# Patient Record
Sex: Female | Born: 1935 | Race: White | Hispanic: No | Smoking: Never smoker
Health system: Southern US, Community
[De-identification: ages and names within clinical notes are randomized; demographics above are authoritative.]

## PROBLEM LIST (undated history)

## (undated) DIAGNOSIS — E039 Hypothyroidism, unspecified: Secondary | ICD-10-CM

## (undated) DIAGNOSIS — C8590 Non-Hodgkin lymphoma, unspecified, unspecified site: Secondary | ICD-10-CM

## (undated) DIAGNOSIS — I2510 Atherosclerotic heart disease of native coronary artery without angina pectoris: Secondary | ICD-10-CM

## (undated) DIAGNOSIS — E7800 Pure hypercholesterolemia, unspecified: Secondary | ICD-10-CM

## (undated) DIAGNOSIS — I4891 Unspecified atrial fibrillation: Secondary | ICD-10-CM

## (undated) HISTORY — PX: CORONARY ARTERY BYPASS GRAFT: SHX141

## (undated) HISTORY — PX: TOTAL KNEE ARTHROPLASTY: SHX125

## (undated) HISTORY — PX: APPENDECTOMY: SHX54

## (undated) HISTORY — DX: Hypothyroidism, unspecified: E03.9

## (undated) HISTORY — PX: ENDARTERECTOMY: SHX5162

## (undated) HISTORY — DX: Pure hypercholesterolemia, unspecified: E78.00

## (undated) HISTORY — DX: Unspecified atrial fibrillation: I48.91

## (undated) HISTORY — DX: Non-Hodgkin lymphoma, unspecified, unspecified site: C85.90

## (undated) HISTORY — PX: ABDOMINAL HYSTERECTOMY: SHX81

## (undated) HISTORY — DX: Atherosclerotic heart disease of native coronary artery without angina pectoris: I25.10

---

## 2002-06-24 ENCOUNTER — Other Ambulatory Visit: Admission: RE | Admit: 2002-06-24 | Discharge: 2002-06-24 | Payer: Self-pay | Admitting: Gynecology

## 2002-09-02 ENCOUNTER — Encounter (INDEPENDENT_AMBULATORY_CARE_PROVIDER_SITE_OTHER): Payer: Self-pay | Admitting: *Deleted

## 2002-09-03 ENCOUNTER — Ambulatory Visit (HOSPITAL_COMMUNITY): Admission: RE | Admit: 2002-09-03 | Discharge: 2002-09-03 | Payer: Self-pay | Admitting: Gynecology

## 2003-08-11 ENCOUNTER — Other Ambulatory Visit: Admission: RE | Admit: 2003-08-11 | Discharge: 2003-08-11 | Payer: Self-pay | Admitting: Gynecology

## 2003-11-24 ENCOUNTER — Ambulatory Visit: Payer: Self-pay | Admitting: Oncology

## 2003-12-08 ENCOUNTER — Ambulatory Visit: Payer: Self-pay

## 2003-12-21 ENCOUNTER — Ambulatory Visit: Payer: Self-pay | Admitting: Oncology

## 2004-02-26 ENCOUNTER — Inpatient Hospital Stay: Payer: Self-pay

## 2004-03-23 ENCOUNTER — Ambulatory Visit: Payer: Self-pay | Admitting: Oncology

## 2004-04-19 ENCOUNTER — Ambulatory Visit: Payer: Self-pay | Admitting: Oncology

## 2004-05-20 ENCOUNTER — Ambulatory Visit: Payer: Self-pay | Admitting: Oncology

## 2004-05-29 ENCOUNTER — Ambulatory Visit: Payer: Self-pay | Admitting: Cardiology

## 2004-06-19 ENCOUNTER — Ambulatory Visit: Payer: Self-pay | Admitting: Oncology

## 2004-07-20 ENCOUNTER — Ambulatory Visit: Payer: Self-pay | Admitting: Oncology

## 2004-08-29 ENCOUNTER — Ambulatory Visit: Payer: Self-pay | Admitting: Oncology

## 2004-09-19 ENCOUNTER — Ambulatory Visit: Payer: Self-pay | Admitting: Oncology

## 2004-10-30 ENCOUNTER — Ambulatory Visit: Payer: Self-pay | Admitting: Oncology

## 2004-11-28 ENCOUNTER — Ambulatory Visit: Payer: Self-pay | Admitting: Oncology

## 2004-12-11 ENCOUNTER — Ambulatory Visit: Payer: Self-pay | Admitting: Internal Medicine

## 2004-12-20 ENCOUNTER — Ambulatory Visit: Payer: Self-pay | Admitting: Oncology

## 2005-02-28 ENCOUNTER — Ambulatory Visit: Payer: Self-pay | Admitting: Oncology

## 2005-03-22 ENCOUNTER — Ambulatory Visit: Payer: Self-pay | Admitting: Oncology

## 2005-04-19 ENCOUNTER — Ambulatory Visit: Payer: Self-pay | Admitting: Oncology

## 2005-05-24 ENCOUNTER — Ambulatory Visit: Payer: Self-pay | Admitting: Oncology

## 2005-05-28 ENCOUNTER — Inpatient Hospital Stay: Payer: Self-pay | Admitting: Internal Medicine

## 2005-06-20 ENCOUNTER — Ambulatory Visit: Payer: Self-pay | Admitting: Oncology

## 2005-06-26 ENCOUNTER — Ambulatory Visit: Payer: Self-pay | Admitting: Urology

## 2005-08-15 ENCOUNTER — Ambulatory Visit: Payer: Self-pay | Admitting: Oncology

## 2005-08-17 ENCOUNTER — Ambulatory Visit: Payer: Self-pay | Admitting: Unknown Physician Specialty

## 2005-08-19 ENCOUNTER — Ambulatory Visit: Payer: Self-pay | Admitting: Oncology

## 2005-09-10 ENCOUNTER — Other Ambulatory Visit: Admission: RE | Admit: 2005-09-10 | Discharge: 2005-09-10 | Payer: Self-pay | Admitting: Gynecology

## 2005-10-17 ENCOUNTER — Ambulatory Visit: Payer: Self-pay | Admitting: Oncology

## 2005-10-20 ENCOUNTER — Ambulatory Visit: Payer: Self-pay | Admitting: Oncology

## 2005-12-18 ENCOUNTER — Ambulatory Visit: Payer: Self-pay | Admitting: Internal Medicine

## 2005-12-20 ENCOUNTER — Ambulatory Visit: Payer: Self-pay | Admitting: Unknown Physician Specialty

## 2006-01-15 ENCOUNTER — Ambulatory Visit: Payer: Self-pay | Admitting: Oncology

## 2006-01-19 ENCOUNTER — Ambulatory Visit: Payer: Self-pay | Admitting: Oncology

## 2006-04-15 ENCOUNTER — Ambulatory Visit: Payer: Self-pay | Admitting: Oncology

## 2006-04-20 ENCOUNTER — Ambulatory Visit: Payer: Self-pay | Admitting: Oncology

## 2006-07-21 ENCOUNTER — Ambulatory Visit: Payer: Self-pay | Admitting: Oncology

## 2006-08-13 ENCOUNTER — Ambulatory Visit: Payer: Self-pay | Admitting: Oncology

## 2006-08-20 ENCOUNTER — Ambulatory Visit: Payer: Self-pay | Admitting: Oncology

## 2006-09-20 ENCOUNTER — Ambulatory Visit: Payer: Self-pay | Admitting: Oncology

## 2006-10-21 ENCOUNTER — Ambulatory Visit: Payer: Self-pay | Admitting: Oncology

## 2006-10-24 ENCOUNTER — Ambulatory Visit: Payer: Self-pay | Admitting: Oncology

## 2006-11-20 ENCOUNTER — Ambulatory Visit: Payer: Self-pay | Admitting: Oncology

## 2006-12-24 ENCOUNTER — Ambulatory Visit: Payer: Self-pay | Admitting: Internal Medicine

## 2007-01-20 ENCOUNTER — Ambulatory Visit: Payer: Self-pay | Admitting: Oncology

## 2007-01-29 ENCOUNTER — Ambulatory Visit: Payer: Self-pay | Admitting: Oncology

## 2007-02-20 ENCOUNTER — Ambulatory Visit: Payer: Self-pay | Admitting: Oncology

## 2007-03-19 ENCOUNTER — Other Ambulatory Visit: Admission: RE | Admit: 2007-03-19 | Discharge: 2007-03-19 | Payer: Self-pay | Admitting: Gynecology

## 2007-03-25 ENCOUNTER — Ambulatory Visit: Payer: Self-pay | Admitting: Unknown Physician Specialty

## 2007-04-20 ENCOUNTER — Ambulatory Visit: Payer: Self-pay | Admitting: Oncology

## 2007-04-21 ENCOUNTER — Ambulatory Visit: Payer: Self-pay | Admitting: Unknown Physician Specialty

## 2007-04-30 ENCOUNTER — Ambulatory Visit: Payer: Self-pay | Admitting: Oncology

## 2007-05-21 ENCOUNTER — Ambulatory Visit: Payer: Self-pay | Admitting: Oncology

## 2007-07-21 ENCOUNTER — Ambulatory Visit: Payer: Self-pay | Admitting: Oncology

## 2007-08-07 ENCOUNTER — Ambulatory Visit: Payer: Self-pay | Admitting: Oncology

## 2007-08-20 ENCOUNTER — Ambulatory Visit: Payer: Self-pay | Admitting: Oncology

## 2007-08-20 HISTORY — PX: VAGINA SURGERY: SHX829

## 2007-09-20 ENCOUNTER — Ambulatory Visit: Payer: Self-pay | Admitting: Oncology

## 2007-10-21 ENCOUNTER — Ambulatory Visit: Payer: Self-pay | Admitting: Oncology

## 2007-11-20 ENCOUNTER — Ambulatory Visit: Payer: Self-pay | Admitting: Oncology

## 2007-12-21 ENCOUNTER — Ambulatory Visit: Payer: Self-pay | Admitting: Oncology

## 2008-01-01 ENCOUNTER — Ambulatory Visit: Payer: Self-pay | Admitting: Internal Medicine

## 2008-01-20 ENCOUNTER — Ambulatory Visit: Payer: Self-pay | Admitting: Oncology

## 2008-01-23 ENCOUNTER — Ambulatory Visit: Payer: Self-pay | Admitting: Oncology

## 2008-02-20 ENCOUNTER — Ambulatory Visit: Payer: Self-pay | Admitting: Oncology

## 2008-03-22 ENCOUNTER — Ambulatory Visit: Payer: Self-pay | Admitting: Oncology

## 2008-04-19 ENCOUNTER — Ambulatory Visit: Payer: Self-pay | Admitting: Oncology

## 2008-05-20 ENCOUNTER — Ambulatory Visit: Payer: Self-pay | Admitting: Oncology

## 2008-05-20 ENCOUNTER — Ambulatory Visit: Payer: Self-pay | Admitting: Nurse Practitioner

## 2008-06-19 ENCOUNTER — Ambulatory Visit: Payer: Self-pay | Admitting: Nurse Practitioner

## 2008-06-19 ENCOUNTER — Ambulatory Visit: Payer: Self-pay | Admitting: Oncology

## 2008-07-20 ENCOUNTER — Ambulatory Visit: Payer: Self-pay | Admitting: Oncology

## 2008-07-22 ENCOUNTER — Ambulatory Visit: Payer: Self-pay | Admitting: Cardiology

## 2008-08-12 ENCOUNTER — Ambulatory Visit: Payer: Self-pay | Admitting: Oncology

## 2008-08-19 ENCOUNTER — Ambulatory Visit: Payer: Self-pay | Admitting: Oncology

## 2008-11-19 ENCOUNTER — Ambulatory Visit: Payer: Self-pay | Admitting: Oncology

## 2008-11-29 ENCOUNTER — Ambulatory Visit: Payer: Self-pay | Admitting: Oncology

## 2008-12-20 ENCOUNTER — Ambulatory Visit: Payer: Self-pay | Admitting: Oncology

## 2009-01-05 ENCOUNTER — Ambulatory Visit: Payer: Self-pay | Admitting: Internal Medicine

## 2009-03-22 ENCOUNTER — Ambulatory Visit: Payer: Self-pay | Admitting: Oncology

## 2009-03-28 ENCOUNTER — Ambulatory Visit: Payer: Self-pay | Admitting: Oncology

## 2009-04-19 ENCOUNTER — Ambulatory Visit: Payer: Self-pay | Admitting: Oncology

## 2009-05-24 ENCOUNTER — Inpatient Hospital Stay: Payer: Self-pay | Admitting: Internal Medicine

## 2009-09-19 ENCOUNTER — Ambulatory Visit: Payer: Self-pay | Admitting: Oncology

## 2009-10-17 ENCOUNTER — Ambulatory Visit: Payer: Self-pay | Admitting: Oncology

## 2009-10-20 ENCOUNTER — Ambulatory Visit: Payer: Self-pay | Admitting: Oncology

## 2009-11-19 ENCOUNTER — Ambulatory Visit: Payer: Self-pay | Admitting: Oncology

## 2009-12-20 ENCOUNTER — Ambulatory Visit: Payer: Self-pay | Admitting: Oncology

## 2010-02-19 ENCOUNTER — Observation Stay: Payer: Self-pay | Admitting: Internal Medicine

## 2010-04-25 ENCOUNTER — Ambulatory Visit: Payer: Self-pay | Admitting: Oncology

## 2010-05-21 ENCOUNTER — Ambulatory Visit: Payer: Self-pay | Admitting: Oncology

## 2010-07-07 NOTE — Op Note (Signed)
NAME:  Anne [name], Anne [name]                       ACCOUNT NO.:  [id number]   MEDICAL RECORD NO.:  [id number]                   PATIENT TYPE:  AMB   LOCATION:  SDC                                  FACILITY:  [facility]   PHYSICIAN:  Timothy P. Fontaine, M.D.           DATE OF BIRTH:  1928-01-30   DATE OF PROCEDURE:  09/03/2002  DATE OF DISCHARGE:                                 OPERATIVE REPORT   PREOPERATIVE DIAGNOSIS:  Persistent vaginal granulation tissue.   POSTOPERATIVE DIAGNOSIS:  Persistent vaginal granulation tissue.   PROCEDURE:  Excision, suspected granulation tissue of the vagina.   SURGEON:  Timothy P. Fontaine, M.D.   ANESTHETIC:  0.25% Marcaine local, inhalational gas with nitrous.   ESTIMATED BLOOD LOSS:  Minimal.   COMPLICATIONS:  None.   SPECIMENS:  Suspected granulation tissue of vagina.   FINDINGS:  Significant rectocele, cuff well supported, second-degree  cystocele, bimanual without gross masses.  Two areas of beefy-red  granulation-type tissue, posterior vaginal mucosa, both measuring  approximately 5-7 mm across.   PROCEDURE:  The patient was taken to the operating room, received  inhalational relaxation with nitrous, placed in the low dorsal lithotomy  position, and received [name] perineal, vaginal preparation with Betadine  solution.  The bladder was emptied with in-and-out Foley catheterization and  the patient was draped in the usual fashion.  An EUA was performed and  subsequently, with [name] Simms retractor, the posterior rectocele was delivered  and inspected and the two areas of granulation-type tissue were exposed.  Both of these areas were infiltrated using 0.25% Marcaine submucosally  raising [name] bleb under each area.  The upper area was then grasped with an  Adson pickup and was excised with [name] scalpel at the level of the surrounding  mucosa.  Care was taken not to enter into the submucosal or rectovaginal  area given the extent of her prior surgery.  After  excision of this area, it  was noted that there was an extrusion of several droplets of milky-type  material from the incision site and the question of whether this represented  pus from an underlying infection versus extrusion of some of the local  anesthetic from the submucosal tissues.  I also entertained the possibility  of [name] rectovaginal fistula from her prior surgery and whether this was the  etiology of her chronic granulation tissue in this area.  I performed [name]  rectal exam and palpated over this area trying to milk out any possible  fecal material to demonstrate [name] rectovaginal fistula and there were no  further whitish extrusions or any evidence of any fecal material coming from  this area.  The rectovaginal septum felt fairly thick throughout the area  with no paper thin areas.  I cleansed this open mucosal defect with Betadine  in the event that it was [name] small collection of pus in attempt to sterilize  the area then placed [name] single suture  of 3-0 Vicryl interrupted to  reapproximate the mucosa noting again that the defect was small, measuring  approximately 5 mm.  The lower granulation tissue area was then again tented  with Adson pickups and sharply incised with [name] scalpel at the level of the  surrounding mucosa.  Care was again given not to enter into the submucosal  plane to avoid underlying tissue damage.  This area was likewise cleansed  with Betadine.  There was no evidence of any whitish fluid at this point.  I  then again performed [name] rectovaginal exam after excision of the granulation  tissue to make sure there was no demonstrable rectovaginal fistula and there  was no evidence of fecal material at this site.  The defect in the mucosa  was then closed using two 3-0 Vicryl interrupted sutures.  The underlying  tissue in the event that there was [name] small collection of pus in this area.  The patient was then placed in the supine position.  She tolerated the  procedure well.   She was taken to the recovery room.                                               Timothy P. Audie Box, M.D.    TPF/MEDQ  D:  09/03/2002  T:  09/04/2002  Job:  [id number]

## 2010-07-07 NOTE — H&P (Signed)
NAME:  Anne Anne [name], Anne Anne [name]                       ACCOUNT NO.:  [id number]   MEDICAL RECORD NO.:  [id number]                   PATIENT TYPE:  AMB   LOCATION:  SDC                                  [facility]ILITY:  [facility]   PHYSICIAN:  Timothy P. Fontaine, M.D.           DATE OF BIRTH:  [birth year]   DATE OF ADMISSION:  09/03/2002  DATE OF DISCHARGE:                                HISTORY & PHYSICAL   CHIEF COMPLAINT:  Vaginal spotting.   HISTORY OF PRESENT ILLNESS:  Anne [name] 77 year old, G3, P3, female with history of  stress urinary incontinence, rectocele, enterocele, who underwent Anne [name]  Durasphere implant, rectocele repair with graft by Dr. Rolanda Jay at  [facility] in January 2003.  Postoperatively, her  course had been complicated by persistent granulation tissue and Anne [name]  recurrence of her rectocele.  She had been receiving silver nitrate  applications to the areas of granulation tissue over the past year with  persistence of two areas of granulation tissue which have continued to  bleed.  She had transferred care to our practice and had Anne [name] biopsy of one of  these areas which showed inflamed granulation tissue.  She is admitted at  this time for excision of the granulation tissue.   PAST MEDICAL HISTORY:  Significant for atherosclerotic heart disease status  post CABG times three, and endarterectomy.  History of MI x2, asthma,  chronic hypertension, hypothyroidism, hypercholesterolemia, GERD, and non-  Hodgkin's lymphoma status post chemotherapy.   PAST SURGICAL HISTORY:  Coronary artery bypass graft x3, endarterectomy,  total abdominal hysterectomy, RSO, appendectomy, tonsillectomy, posterior  repair with Durasphere implant.   ALLERGIES:  BENADRYL.   MEDICATIONS:  1. Caltrate 600 with D two times Anne [name] day.  2. Prilosec 20 daily.  3. Synthroid 0.88 mg daily.  4. Ecotrin 81 mg daily.  5. Lipitor 20 mg daily.  6. Cozaar 50 mg daily.  7. Lopressor 50 mg one and Anne [name] half tabs  two times per day.  8. Lasix 40 mg daily.  9. K-Dur 20 mg daily.  10.      Allegra 180 mg daily.  11.      Singulair 10 mg daily.  12.      Flonase 50 mg spray daily.  13.      Advair one spray q.12h.   REVIEW OF SYSTEMS:  Noncontributory.   SOCIAL HISTORY:  Noncontributory.   FAMILY HISTORY:  Noncontributory.   ADMISSION PHYSICAL EXAMINATION:  VITAL SIGNS:  Afebrile.  Vital signs are  stable.  HEENT:  Normal.  LUNGS:  Clear.  CARDIAC:  Regular rate.  No rubs, murmurs, or gallops.  ABDOMEN:  Benign.  PELVIC:  External BUS, vagina with first to second degree cystocele, second  to third degree rectocele with two areas of granulation tissue posteriorly.  Bimanual is without masses or tenderness.   ASSESSMENT:  Anne [name] 77 year old, G3, P3, female status post Durasphere implant,  rectocele repair  with graft, January 2003, with persistent granulation  tissue.  The areas are very superficial and have been treated multiple times  with silver nitrate and have not resolved themselves.  The patient is  admitted at this time for Anne [name] superficial excision of these areas.  I  discussed with the patient the proposed surgery.  She understands that we  are not going to repair her rectocele or deliver any definitive surgery as  far as her pelvic relaxation is concerned.  We are going to superficially  excise the areas of granulation tissue and reapproximate the mucosa, and  then hopefully this will resolve her spotting and bleeding as Anne [name] result of  these granulation tissue areas.  I discussed the risks including infection,  abscess formation, and drainage, rectal damage with fistula, hemorrhage  necessitating transfusion, were all discussed, understood, and accepted.  The patient's questions were answered to her satisfaction, and she is ready  to proceed with surgery.  Due to her significant medical history, I  discussed with her that I plan on doing this under local, which I think will  be easy to  accomplish, and will plan on some submucosal local injection with  again Anne [name] superficial excision and reapproximation of the mucosa.  The  patient's questions were answered to her satisfaction, and she is ready to  proceed with surgery.                                               Timothy P. Audie Box, M.D.    TPF/MEDQ  D:  09/02/2002  T:  09/03/2002  Job:  [id number]

## 2010-07-20 ENCOUNTER — Ambulatory Visit (INDEPENDENT_AMBULATORY_CARE_PROVIDER_SITE_OTHER): Payer: Medicare Other | Admitting: Gynecology

## 2010-07-20 DIAGNOSIS — R350 Frequency of micturition: Secondary | ICD-10-CM

## 2010-07-20 DIAGNOSIS — N952 Postmenopausal atrophic vaginitis: Secondary | ICD-10-CM

## 2010-07-20 DIAGNOSIS — R82998 Other abnormal findings in urine: Secondary | ICD-10-CM

## 2010-08-07 ENCOUNTER — Emergency Department: Payer: Self-pay

## 2010-08-14 ENCOUNTER — Emergency Department: Payer: Self-pay | Admitting: Emergency Medicine

## 2010-11-06 ENCOUNTER — Ambulatory Visit: Payer: Self-pay | Admitting: Oncology

## 2010-11-20 ENCOUNTER — Ambulatory Visit: Payer: Self-pay | Admitting: Oncology

## 2011-01-29 ENCOUNTER — Ambulatory Visit: Payer: Self-pay | Admitting: Oncology

## 2011-02-20 ENCOUNTER — Ambulatory Visit: Payer: Self-pay | Admitting: Oncology

## 2011-04-27 ENCOUNTER — Inpatient Hospital Stay: Payer: Self-pay | Admitting: Student

## 2011-04-27 LAB — URINALYSIS, COMPLETE
Hyaline Cast: 9
Ketone: NEGATIVE
Leukocyte Esterase: NEGATIVE
Nitrite: NEGATIVE
Ph: 5 (ref 4.5–8.0)
Protein: NEGATIVE
RBC,UR: 1 /HPF (ref 0–5)

## 2011-04-27 LAB — COMPREHENSIVE METABOLIC PANEL
Albumin: 3.1 g/dL — ABNORMAL LOW (ref 3.4–5.0)
Alkaline Phosphatase: 166 U/L — ABNORMAL HIGH (ref 50–136)
Anion Gap: 15 (ref 7–16)
Calcium, Total: 9.1 mg/dL (ref 8.5–10.1)
Co2: 26 mmol/L (ref 21–32)
EGFR (African American): 25 — ABNORMAL LOW
EGFR (Non-African Amer.): 21 — ABNORMAL LOW
Glucose: 76 mg/dL (ref 65–99)
Osmolality: 272 (ref 275–301)
Potassium: 4.3 mmol/L (ref 3.5–5.1)
SGOT(AST): 29 U/L (ref 15–37)

## 2011-04-27 LAB — CBC
HCT: 27.9 % — ABNORMAL LOW (ref 35.0–47.0)
HGB: 9.4 g/dL — ABNORMAL LOW (ref 12.0–16.0)
MCH: 34.8 pg — ABNORMAL HIGH (ref 26.0–34.0)
MCHC: 33.7 g/dL (ref 32.0–36.0)
MCV: 103 fL — ABNORMAL HIGH (ref 80–100)
Platelet: 152 10*3/uL (ref 150–440)
RBC: 2.7 10*6/uL — ABNORMAL LOW (ref 3.80–5.20)
WBC: 10.1 10*3/uL (ref 3.6–11.0)

## 2011-04-27 LAB — PROTIME-INR: INR: 3.6

## 2011-04-27 LAB — CK TOTAL AND CKMB (NOT AT ARMC)
CK, Total: 32 U/L (ref 21–215)
CK-MB: 1.1 ng/mL (ref 0.5–3.6)

## 2011-04-27 LAB — FOLATE: Folic Acid: 12.2 ng/mL (ref 3.1–100.0)

## 2011-04-27 LAB — PRO B NATRIURETIC PEPTIDE: B-Type Natriuretic Peptide: 29434 pg/mL — ABNORMAL HIGH (ref 0–450)

## 2011-04-28 LAB — BASIC METABOLIC PANEL
Anion Gap: 16 (ref 7–16)
BUN: 56 mg/dL — ABNORMAL HIGH (ref 7–18)
Calcium, Total: 8.6 mg/dL (ref 8.5–10.1)
EGFR (Non-African Amer.): 23 — ABNORMAL LOW
Glucose: 75 mg/dL (ref 65–99)
Osmolality: 271 (ref 275–301)
Potassium: 4.1 mmol/L (ref 3.5–5.1)
Sodium: 128 mmol/L — ABNORMAL LOW (ref 136–145)

## 2011-04-28 LAB — CBC WITH DIFFERENTIAL/PLATELET
Basophil #: 0 10*3/uL (ref 0.0–0.1)
Basophil %: 0 %
Eosinophil #: 0 10*3/uL (ref 0.0–0.7)
MCHC: 34.5 g/dL (ref 32.0–36.0)
MCV: 102 fL — ABNORMAL HIGH (ref 80–100)
Monocyte #: 0.5 10*3/uL (ref 0.0–0.7)
Monocyte %: 5.1 %
Neutrophil #: 8.1 10*3/uL — ABNORMAL HIGH (ref 1.4–6.5)
Neutrophil %: 89.4 %
Platelet: 130 10*3/uL — ABNORMAL LOW (ref 150–440)
RBC: 2.51 10*6/uL — ABNORMAL LOW (ref 3.80–5.20)
WBC: 9 10*3/uL (ref 3.6–11.0)

## 2011-04-28 LAB — HEPATIC FUNCTION PANEL A (ARMC)
Albumin: 2.6 g/dL — ABNORMAL LOW (ref 3.4–5.0)
Alkaline Phosphatase: 144 U/L — ABNORMAL HIGH (ref 50–136)
SGOT(AST): 20 U/L (ref 15–37)
SGPT (ALT): 18 U/L
Total Protein: 6.6 g/dL (ref 6.4–8.2)

## 2011-04-28 LAB — CK TOTAL AND CKMB (NOT AT ARMC): CK, Total: 32 U/L (ref 21–215)

## 2011-04-28 LAB — HEMOGLOBIN A1C: Hemoglobin A1C: 5.7 % (ref 4.2–6.3)

## 2011-04-28 LAB — PROTIME-INR
INR: 5.1
Prothrombin Time: 46.7 secs — ABNORMAL HIGH (ref 11.5–14.7)

## 2011-04-28 LAB — LIPID PANEL: Ldl Cholesterol, Calc: 54 mg/dL (ref 0–100)

## 2011-04-29 ENCOUNTER — Ambulatory Visit: Payer: Self-pay | Admitting: Urology

## 2011-04-29 LAB — CULTURE, BLOOD (SINGLE)

## 2011-04-29 LAB — CREATININE, SERUM
EGFR (African American): 36 — ABNORMAL LOW
EGFR (Non-African Amer.): 30 — ABNORMAL LOW

## 2011-04-29 LAB — URINALYSIS, COMPLETE
Bilirubin,UR: NEGATIVE
Glucose,UR: NEGATIVE mg/dL (ref 0–75)
Ketone: NEGATIVE
Nitrite: NEGATIVE
Ph: 6 (ref 4.5–8.0)
RBC,UR: 1 /HPF (ref 0–5)
Squamous Epithelial: NONE SEEN
WBC UR: 1 /HPF (ref 0–5)

## 2011-04-29 LAB — PROTIME-INR
INR: 1.5
Prothrombin Time: 18.5 secs — ABNORMAL HIGH (ref 11.5–14.7)

## 2011-04-30 LAB — BASIC METABOLIC PANEL
Anion Gap: 13 (ref 7–16)
Calcium, Total: 8.6 mg/dL (ref 8.5–10.1)
Co2: 25 mmol/L (ref 21–32)
Creatinine: 0.9 mg/dL (ref 0.60–1.30)
EGFR (African American): >60
EGFR (Non-African Amer.): >60
Glucose: 98 mg/dL (ref 65–99)
Sodium: 135 mmol/L — ABNORMAL LOW (ref 136–145)

## 2011-04-30 LAB — PROTIME-INR
INR: 1.1
Prothrombin Time: 14.3 secs (ref 11.5–14.7)

## 2011-04-30 LAB — STOOL CULTURE

## 2011-05-01 LAB — PROTIME-INR
INR: 1.3
Prothrombin Time: 16.5 secs — ABNORMAL HIGH (ref 11.5–14.7)

## 2011-05-02 LAB — COMPREHENSIVE METABOLIC PANEL
Alkaline Phosphatase: 150 U/L — ABNORMAL HIGH (ref 50–136)
Anion Gap: 11 (ref 7–16)
BUN: 19 mg/dL — ABNORMAL HIGH (ref 7–18)
Bilirubin,Total: 0.5 mg/dL (ref 0.2–1.0)
Co2: 27 mmol/L (ref 21–32)
Creatinine: 0.94 mg/dL (ref 0.60–1.30)
EGFR (African American): >60
EGFR (Non-African Amer.): >60
Glucose: 84 mg/dL (ref 65–99)
Potassium: 4.6 mmol/L (ref 3.5–5.1)
SGPT (ALT): 11 U/L — ABNORMAL LOW
Total Protein: 6.2 g/dL — ABNORMAL LOW (ref 6.4–8.2)

## 2011-05-02 LAB — PROTIME-INR
INR: 1.6
Prothrombin Time: 19.5 secs — ABNORMAL HIGH (ref 11.5–14.7)

## 2011-05-03 LAB — PROTIME-INR
INR: 1.8
Prothrombin Time: 20.8 secs — ABNORMAL HIGH (ref 11.5–14.7)

## 2011-05-04 LAB — PROTIME-INR: Prothrombin Time: 22.9 secs — ABNORMAL HIGH (ref 11.5–14.7)

## 2011-05-04 LAB — THROAT CULTURE

## 2011-05-05 LAB — PROTIME-INR
INR: 2.6
Prothrombin Time: 28.2 secs — ABNORMAL HIGH (ref 11.5–14.7)

## 2011-05-05 LAB — CULTURE, BLOOD (SINGLE)

## 2011-05-06 LAB — CBC WITH DIFFERENTIAL/PLATELET
Basophil %: 0.3 %
Eosinophil #: 0.1 10*3/uL (ref 0.0–0.7)
HCT: 29.2 % — ABNORMAL LOW (ref 35.0–47.0)
HGB: 9.8 g/dL — ABNORMAL LOW (ref 12.0–16.0)
Lymphocyte %: 9.8 %
MCHC: 33.4 g/dL (ref 32.0–36.0)
Monocyte #: 1 10*3/uL — ABNORMAL HIGH (ref 0.0–0.7)
Monocyte %: 11.9 %
Neutrophil #: 6.8 10*3/uL — ABNORMAL HIGH (ref 1.4–6.5)
Neutrophil %: 77.2 %
Platelet: 422 10*3/uL (ref 150–440)
RDW: 12.9 % (ref 11.5–14.5)
WBC: 8.8 10*3/uL (ref 3.6–11.0)

## 2011-05-06 LAB — BASIC METABOLIC PANEL
Anion Gap: 8 (ref 7–16)
BUN: 23 mg/dL — ABNORMAL HIGH (ref 7–18)
Creatinine: 0.91 mg/dL (ref 0.60–1.30)
EGFR (African American): >60
EGFR (Non-African Amer.): >60
Glucose: 79 mg/dL (ref 65–99)
Potassium: 5 mmol/L (ref 3.5–5.1)

## 2011-05-07 LAB — PROTIME-INR
INR: 2.3
Prothrombin Time: 25.1 secs — ABNORMAL HIGH (ref 11.5–14.7)

## 2011-05-07 LAB — DIGOXIN LEVEL: Digoxin: 3.2 ng/mL

## 2011-05-08 LAB — DIGOXIN LEVEL: Digoxin: 2.7 ng/mL

## 2011-05-10 ENCOUNTER — Encounter: Payer: Self-pay | Admitting: Internal Medicine

## 2011-05-10 LAB — DIGOXIN LEVEL: Digoxin: 1.53 ng/mL

## 2011-05-15 LAB — CBC WITH DIFFERENTIAL/PLATELET
Basophil #: 0 10*3/uL (ref 0.0–0.1)
Basophil %: 1 %
Eosinophil %: 3.6 %
HCT: 28.4 % — ABNORMAL LOW (ref 35.0–47.0)
Lymphocyte #: 0.8 10*3/uL — ABNORMAL LOW (ref 1.0–3.6)
Lymphocyte %: 17.2 %
MCHC: 33.7 g/dL (ref 32.0–36.0)
MCV: 99 fL (ref 80–100)
Monocyte #: 0.9 10*3/uL — ABNORMAL HIGH (ref 0.0–0.7)
Monocyte %: 18.1 %
Neutrophil #: 3 10*3/uL (ref 1.4–6.5)
Platelet: 323 10*3/uL (ref 150–440)
RDW: 12.7 % (ref 11.5–14.5)

## 2011-05-21 ENCOUNTER — Encounter: Payer: Self-pay | Admitting: Internal Medicine

## 2011-05-29 ENCOUNTER — Inpatient Hospital Stay: Payer: Self-pay | Admitting: *Deleted

## 2011-05-29 LAB — URINALYSIS, COMPLETE
Bilirubin,UR: NEGATIVE
Leukocyte Esterase: NEGATIVE
Nitrite: NEGATIVE
Ph: 7 (ref 4.5–8.0)
Specific Gravity: 1.011 (ref 1.003–1.030)
Squamous Epithelial: NONE SEEN

## 2011-05-29 LAB — COMPREHENSIVE METABOLIC PANEL
Albumin: 3.5 g/dL (ref 3.4–5.0)
Alkaline Phosphatase: 167 U/L — ABNORMAL HIGH (ref 50–136)
Anion Gap: 10 (ref 7–16)
BUN: 10 mg/dL (ref 7–18)
Chloride: 88 mmol/L — ABNORMAL LOW (ref 98–107)
Creatinine: 0.9 mg/dL (ref 0.60–1.30)
EGFR (African American): >60
EGFR (Non-African Amer.): >60
Glucose: 97 mg/dL (ref 65–99)
Osmolality: 249 (ref 275–301)
Potassium: 4.7 mmol/L (ref 3.5–5.1)
SGPT (ALT): 18 U/L
Sodium: 124 mmol/L — ABNORMAL LOW (ref 136–145)
Total Protein: 6.6 g/dL (ref 6.4–8.2)

## 2011-05-29 LAB — CBC
HGB: 9.7 g/dL — ABNORMAL LOW (ref 12.0–16.0)
RDW: 13.9 % (ref 11.5–14.5)

## 2011-05-29 LAB — CK TOTAL AND CKMB (NOT AT ARMC): CK-MB: 4.7 ng/mL — ABNORMAL HIGH (ref 0.5–3.6)

## 2011-05-30 LAB — BASIC METABOLIC PANEL
BUN: 12 mg/dL (ref 7–18)
Calcium, Total: 9.4 mg/dL (ref 8.5–10.1)
Co2: 21 mmol/L (ref 21–32)
Creatinine: 0.82 mg/dL (ref 0.60–1.30)
EGFR (African American): >60
Glucose: 83 mg/dL (ref 65–99)
Potassium: 4.3 mmol/L (ref 3.5–5.1)
Sodium: 124 mmol/L — ABNORMAL LOW (ref 136–145)

## 2011-05-30 LAB — CBC WITH DIFFERENTIAL/PLATELET
Basophil #: 0 10*3/uL (ref 0.0–0.1)
Basophil %: 0.2 %
Eosinophil #: 0 10*3/uL (ref 0.0–0.7)
Lymphocyte #: 1.3 10*3/uL (ref 1.0–3.6)
Lymphocyte %: 11.9 %
MCH: 32.9 pg (ref 26.0–34.0)
MCHC: 33.3 g/dL (ref 32.0–36.0)
Neutrophil #: 8.1 10*3/uL — ABNORMAL HIGH (ref 1.4–6.5)
Neutrophil %: 76.5 %
Platelet: 208 10*3/uL (ref 150–440)
RBC: 3.01 10*6/uL — ABNORMAL LOW (ref 3.80–5.20)
WBC: 10.6 10*3/uL (ref 3.6–11.0)

## 2011-05-30 LAB — PROTIME-INR
INR: 1.2
Prothrombin Time: 16 secs — ABNORMAL HIGH (ref 11.5–14.7)

## 2011-05-30 LAB — CK TOTAL AND CKMB (NOT AT ARMC): CK-MB: 18.5 ng/mL — ABNORMAL HIGH (ref 0.5–3.6)

## 2011-05-30 LAB — MAGNESIUM: Magnesium: 1.4 mg/dL — ABNORMAL LOW

## 2011-05-30 LAB — TROPONIN I: Troponin-I: <0.02 ng/mL

## 2011-05-31 LAB — MAGNESIUM: Magnesium: 2 mg/dL

## 2011-05-31 LAB — LIPID PANEL
Cholesterol: 119 mg/dL (ref 0–200)
HDL Cholesterol: 34 mg/dL — ABNORMAL LOW (ref 40–60)
Triglycerides: 76 mg/dL (ref 0–200)

## 2011-06-01 LAB — BASIC METABOLIC PANEL
Anion Gap: 11 (ref 7–16)
Co2: 22 mmol/L (ref 21–32)
Creatinine: 0.68 mg/dL (ref 0.60–1.30)
EGFR (African American): >60
EGFR (Non-African Amer.): >60
Glucose: 78 mg/dL (ref 65–99)
Potassium: 3.5 mmol/L (ref 3.5–5.1)

## 2011-06-01 LAB — SODIUM, URINE, RANDOM: Sodium, Urine Random: 74 mmol/L (ref 20–110)

## 2011-06-01 LAB — PROTIME-INR: INR: 1.4

## 2011-06-01 LAB — OSMOLALITY, URINE: Osmolality: 501 mOsm/kg

## 2011-06-03 LAB — BASIC METABOLIC PANEL
Anion Gap: 10 (ref 7–16)
Co2: 23 mmol/L (ref 21–32)
Creatinine: 0.69 mg/dL (ref 0.60–1.30)
EGFR (African American): >60
EGFR (Non-African Amer.): >60
Glucose: 87 mg/dL (ref 65–99)

## 2011-06-03 LAB — PROTIME-INR
INR: 1.4
Prothrombin Time: 17.7 secs — ABNORMAL HIGH (ref 11.5–14.7)

## 2011-06-03 LAB — CULTURE, BLOOD (SINGLE)

## 2011-06-04 LAB — CULTURE, BLOOD (SINGLE)

## 2011-06-04 LAB — BASIC METABOLIC PANEL
BUN: 8 mg/dL (ref 7–18)
Calcium, Total: 7.9 mg/dL — ABNORMAL LOW (ref 8.5–10.1)
Co2: 22 mmol/L (ref 21–32)
Creatinine: 0.63 mg/dL (ref 0.60–1.30)
EGFR (Non-African Amer.): >60
Glucose: 80 mg/dL (ref 65–99)
Potassium: 3.7 mmol/L (ref 3.5–5.1)
Sodium: 135 mmol/L — ABNORMAL LOW (ref 136–145)

## 2011-06-04 LAB — PROTIME-INR: Prothrombin Time: 20.5 secs — ABNORMAL HIGH (ref 11.5–14.7)

## 2011-06-20 ENCOUNTER — Encounter: Payer: Self-pay | Admitting: Internal Medicine

## 2011-06-30 ENCOUNTER — Emergency Department: Payer: Self-pay | Admitting: *Deleted

## 2011-06-30 LAB — BASIC METABOLIC PANEL
Calcium, Total: 8.9 mg/dL (ref 8.5–10.1)
Chloride: 96 mmol/L — ABNORMAL LOW (ref 98–107)
Co2: 24 mmol/L (ref 21–32)
Creatinine: 0.93 mg/dL (ref 0.60–1.30)
EGFR (Non-African Amer.): 58 — ABNORMAL LOW
Potassium: 4.1 mmol/L (ref 3.5–5.1)
Sodium: 129 mmol/L — ABNORMAL LOW (ref 136–145)

## 2011-06-30 LAB — CBC WITH DIFFERENTIAL/PLATELET
Basophil #: 0 10*3/uL (ref 0.0–0.1)
Basophil %: 0.4 %
Eosinophil #: 0.1 10*3/uL (ref 0.0–0.7)
Eosinophil %: 0.9 %
HGB: 10.5 g/dL — ABNORMAL LOW (ref 12.0–16.0)
Lymphocyte %: 24.5 %
MCH: 33.4 pg (ref 26.0–34.0)
Monocyte #: 0.8 x10 3/mm (ref 0.2–0.9)
Monocyte %: 12.6 %
Neutrophil %: 61.6 %
RBC: 3.13 10*6/uL — ABNORMAL LOW (ref 3.80–5.20)

## 2011-06-30 LAB — PROTIME-INR
INR: 2.5
Prothrombin Time: 27.3 secs — ABNORMAL HIGH (ref 11.5–14.7)

## 2011-07-05 ENCOUNTER — Emergency Department: Payer: Self-pay | Admitting: *Deleted

## 2011-07-05 LAB — CBC WITH DIFFERENTIAL/PLATELET
Basophil #: 0 10*3/uL (ref 0.0–0.1)
Eosinophil #: 0.1 10*3/uL (ref 0.0–0.7)
HGB: 10.6 g/dL — ABNORMAL LOW (ref 12.0–16.0)
Lymphocyte #: 1.5 10*3/uL (ref 1.0–3.6)
Lymphocyte %: 22.1 %
MCHC: 32.9 g/dL (ref 32.0–36.0)
Monocyte #: 0.9 x10 3/mm (ref 0.2–0.9)
Neutrophil #: 4.2 10*3/uL (ref 1.4–6.5)
Neutrophil %: 63.3 %
Platelet: 242 10*3/uL (ref 150–440)
RBC: 3.26 10*6/uL — ABNORMAL LOW (ref 3.80–5.20)
RDW: 15.1 % — ABNORMAL HIGH (ref 11.5–14.5)
WBC: 6.7 10*3/uL (ref 3.6–11.0)

## 2011-07-05 LAB — URINALYSIS, COMPLETE
Hyaline Cast: 1
Leukocyte Esterase: NEGATIVE
Nitrite: NEGATIVE
Ph: 9 (ref 4.5–8.0)
Specific Gravity: 1.016 (ref 1.003–1.030)

## 2011-07-05 LAB — PROTIME-INR: INR: 3.4

## 2011-07-30 ENCOUNTER — Ambulatory Visit: Payer: Self-pay | Admitting: Oncology

## 2011-07-30 LAB — COMPREHENSIVE METABOLIC PANEL
Alkaline Phosphatase: 193 U/L — ABNORMAL HIGH (ref 50–136)
Anion Gap: 10 (ref 7–16)
Calcium, Total: 9.1 mg/dL (ref 8.5–10.1)
Chloride: 96 mmol/L — ABNORMAL LOW (ref 98–107)
Co2: 25 mmol/L (ref 21–32)
Creatinine: 1.25 mg/dL (ref 0.60–1.30)
Glucose: 95 mg/dL (ref 65–99)
Potassium: 4.8 mmol/L (ref 3.5–5.1)
SGOT(AST): 18 U/L (ref 15–37)
SGPT (ALT): 16 U/L

## 2011-07-30 LAB — CBC CANCER CENTER
Basophil #: 0 x10 3/mm (ref 0.0–0.1)
Eosinophil %: 2.1 %
Lymphocyte #: 1.3 x10 3/mm (ref 1.0–3.6)
MCH: 32.7 pg (ref 26.0–34.0)
MCHC: 32.8 g/dL (ref 32.0–36.0)
MCV: 100 fL (ref 80–100)
Monocyte #: 0.8 x10 3/mm (ref 0.2–0.9)
Monocyte %: 14.1 %
Platelet: 270 x10 3/mm (ref 150–440)
RDW: 16 % — ABNORMAL HIGH (ref 11.5–14.5)

## 2011-08-20 ENCOUNTER — Ambulatory Visit: Payer: Self-pay | Admitting: Oncology

## 2011-11-28 ENCOUNTER — Emergency Department: Payer: Self-pay | Admitting: Emergency Medicine

## 2011-11-29 LAB — CBC WITH DIFFERENTIAL/PLATELET
Eosinophil #: 0.2 10*3/uL (ref 0.0–0.7)
Eosinophil %: 2.9 %
MCH: 34.9 pg — ABNORMAL HIGH (ref 26.0–34.0)
Monocyte #: 0.9 x10 3/mm (ref 0.2–0.9)
Neutrophil %: 70.1 %
Platelet: 202 10*3/uL (ref 150–440)

## 2012-01-04 ENCOUNTER — Other Ambulatory Visit: Payer: Self-pay | Admitting: Podiatry

## 2012-01-20 ENCOUNTER — Ambulatory Visit: Payer: Self-pay | Admitting: Internal Medicine

## 2012-01-23 ENCOUNTER — Inpatient Hospital Stay: Payer: Self-pay | Admitting: Internal Medicine

## 2012-01-23 LAB — URINALYSIS, COMPLETE
Bilirubin,UR: NEGATIVE
Nitrite: NEGATIVE
Protein: NEGATIVE
RBC,UR: <1 /HPF (ref 0–5)
WBC UR: 1 /HPF (ref 0–5)

## 2012-01-23 LAB — BASIC METABOLIC PANEL
Anion Gap: 11 (ref 7–16)
BUN: 111 mg/dL — ABNORMAL HIGH (ref 7–18)
Chloride: 103 mmol/L (ref 98–107)
EGFR (African American): 19 — ABNORMAL LOW
EGFR (Non-African Amer.): 16 — ABNORMAL LOW
Glucose: 101 mg/dL — ABNORMAL HIGH (ref 65–99)
Osmolality: 313 (ref 275–301)

## 2012-01-23 LAB — COMPREHENSIVE METABOLIC PANEL
Alkaline Phosphatase: 231 U/L — ABNORMAL HIGH (ref 50–136)
BUN: 130 mg/dL — ABNORMAL HIGH (ref 7–18)
Calcium, Total: 8.9 mg/dL (ref 8.5–10.1)
Co2: 23 mmol/L (ref 21–32)
EGFR (African American): 17 — ABNORMAL LOW
EGFR (Non-African Amer.): 14 — ABNORMAL LOW
Glucose: 89 mg/dL (ref 65–99)
Osmolality: 308 (ref 275–301)
SGPT (ALT): 17 U/L (ref 12–78)

## 2012-01-23 LAB — CK TOTAL AND CKMB (NOT AT ARMC)
CK, Total: 55 U/L (ref 21–215)
CK-MB: 2 ng/mL (ref 0.5–3.6)

## 2012-01-23 LAB — CBC
HCT: 28.3 % — ABNORMAL LOW (ref 35.0–47.0)
HGB: 9.4 g/dL — ABNORMAL LOW (ref 12.0–16.0)
MCH: 33.6 pg (ref 26.0–34.0)
MCHC: 33.3 g/dL (ref 32.0–36.0)
RDW: 13.5 % (ref 11.5–14.5)

## 2012-01-23 LAB — LIPASE, BLOOD: Lipase: 68 U/L — ABNORMAL LOW (ref 73–393)

## 2012-01-23 LAB — PROTIME-INR: Prothrombin Time: 14.9 secs — ABNORMAL HIGH (ref 11.5–14.7)

## 2012-01-24 LAB — BASIC METABOLIC PANEL
Anion Gap: 9 (ref 7–16)
BUN: 103 mg/dL — ABNORMAL HIGH (ref 7–18)
Calcium, Total: 8.7 mg/dL (ref 8.5–10.1)
Chloride: 107 mmol/L (ref 98–107)
Osmolality: 311 (ref 275–301)
Potassium: 3.6 mmol/L (ref 3.5–5.1)
Sodium: 140 mmol/L (ref 136–145)

## 2012-01-24 LAB — CBC WITH DIFFERENTIAL/PLATELET
Basophil #: 0 10*3/uL (ref 0.0–0.1)
Basophil %: 0.5 %
Eosinophil #: 0.1 10*3/uL (ref 0.0–0.7)
Lymphocyte #: 0.8 10*3/uL — ABNORMAL LOW (ref 1.0–3.6)
MCV: 102 fL — ABNORMAL HIGH (ref 80–100)
Monocyte #: 0.7 x10 3/mm (ref 0.2–0.9)
Monocyte %: 18.8 %
Neutrophil %: 54.4 %
Platelet: 126 10*3/uL — ABNORMAL LOW (ref 150–440)
RBC: 2.63 10*6/uL — ABNORMAL LOW (ref 3.80–5.20)
RDW: 13.3 % (ref 11.5–14.5)
WBC: 3.6 10*3/uL (ref 3.6–11.0)

## 2012-01-24 LAB — PROTIME-INR: Prothrombin Time: 14.5 secs (ref 11.5–14.7)

## 2012-01-25 LAB — BASIC METABOLIC PANEL
Anion Gap: 9 (ref 7–16)
Anion Gap: 7 (ref 7–16)
BUN: 64 mg/dL — ABNORMAL HIGH (ref 7–18)
Calcium, Total: 8.3 mg/dL — ABNORMAL LOW (ref 8.5–10.1)
Co2: 22 mmol/L (ref 21–32)
Co2: 24 mmol/L (ref 21–32)
Creatinine: 1.57 mg/dL — ABNORMAL HIGH (ref 0.60–1.30)
EGFR (African American): 35 — ABNORMAL LOW
EGFR (African American): 42 — ABNORMAL LOW
EGFR (Non-African Amer.): 30 — ABNORMAL LOW
EGFR (Non-African Amer.): 36 — ABNORMAL LOW
Glucose: 88 mg/dL (ref 65–99)
Glucose: 141 mg/dL — ABNORMAL HIGH (ref 65–99)
Osmolality: 308 (ref 275–301)
Osmolality: 300 (ref 275–301)
Potassium: 3.7 mmol/L (ref 3.5–5.1)
Sodium: 142 mmol/L (ref 136–145)

## 2012-01-25 LAB — CBC WITH DIFFERENTIAL/PLATELET
Basophil #: 0 10*3/uL (ref 0.0–0.1)
Basophil %: 0.6 %
Eosinophil %: 3 %
Lymphocyte #: 0.7 10*3/uL — ABNORMAL LOW (ref 1.0–3.6)
MCH: 34 pg (ref 26.0–34.0)
Monocyte #: 0.5 x10 3/mm (ref 0.2–0.9)
Monocyte %: 18.7 %
Neutrophil %: 52.7 %
Platelet: 113 10*3/uL — ABNORMAL LOW (ref 150–440)
RBC: 2.39 10*6/uL — ABNORMAL LOW (ref 3.80–5.20)
RDW: 13.2 % (ref 11.5–14.5)
WBC: 2.7 10*3/uL — ABNORMAL LOW (ref 3.6–11.0)

## 2012-01-25 LAB — PROTIME-INR: INR: 1.2

## 2012-01-26 LAB — CBC WITH DIFFERENTIAL/PLATELET
Basophil %: 0.4 %
Eosinophil #: 0.1 10*3/uL (ref 0.0–0.7)
HCT: 28 % — ABNORMAL LOW (ref 35.0–47.0)
HGB: 9.4 g/dL — ABNORMAL LOW (ref 12.0–16.0)
Lymphocyte #: 0.9 10*3/uL — ABNORMAL LOW (ref 1.0–3.6)
Lymphocyte %: 16.3 %
MCH: 34.1 pg — ABNORMAL HIGH (ref 26.0–34.0)
MCHC: 33.5 g/dL (ref 32.0–36.0)
MCV: 102 fL — ABNORMAL HIGH (ref 80–100)
Monocyte #: 0.8 x10 3/mm (ref 0.2–0.9)
Neutrophil #: 3.7 10*3/uL (ref 1.4–6.5)
RDW: 13.3 % (ref 11.5–14.5)

## 2012-01-26 LAB — BASIC METABOLIC PANEL
BUN: 36 mg/dL — ABNORMAL HIGH (ref 7–18)
Co2: 25 mmol/L (ref 21–32)
EGFR (African American): 49 — ABNORMAL LOW
EGFR (Non-African Amer.): 42 — ABNORMAL LOW
Glucose: 113 mg/dL — ABNORMAL HIGH (ref 65–99)

## 2012-01-26 LAB — PROTIME-INR
INR: 1.1
Prothrombin Time: 14.5 s

## 2012-02-20 ENCOUNTER — Ambulatory Visit: Payer: Self-pay | Admitting: Internal Medicine

## 2012-02-23 ENCOUNTER — Inpatient Hospital Stay: Payer: Self-pay | Admitting: Internal Medicine

## 2012-02-23 LAB — COMPREHENSIVE METABOLIC PANEL
Albumin: 2.8 g/dL — ABNORMAL LOW (ref 3.4–5.0)
Alkaline Phosphatase: 250 U/L — ABNORMAL HIGH (ref 50–136)
Bilirubin,Total: 0.9 mg/dL (ref 0.2–1.0)
Chloride: 97 mmol/L — ABNORMAL LOW (ref 98–107)
Co2: 26 mmol/L (ref 21–32)
EGFR (African American): 47 — ABNORMAL LOW
Glucose: 96 mg/dL (ref 65–99)
Potassium: 3.5 mmol/L (ref 3.5–5.1)
SGOT(AST): 21 U/L (ref 15–37)
SGPT (ALT): 16 U/L (ref 12–78)
Sodium: 133 mmol/L — ABNORMAL LOW (ref 136–145)
Total Protein: 6.4 g/dL (ref 6.4–8.2)

## 2012-02-23 LAB — CBC
HGB: 9 g/dL — ABNORMAL LOW (ref 12.0–16.0)
MCH: 34.2 pg — ABNORMAL HIGH (ref 26.0–34.0)
MCV: 99 fL (ref 80–100)
Platelet: 183 10*3/uL (ref 150–440)
RDW: 13.6 % (ref 11.5–14.5)
WBC: 8.2 10*3/uL (ref 3.6–11.0)

## 2012-02-23 LAB — URINALYSIS, COMPLETE
Bacteria: NONE SEEN
Blood: NEGATIVE
Glucose,UR: NEGATIVE mg/dL (ref 0–75)
Nitrite: NEGATIVE
Ph: 6 (ref 4.5–8.0)
Protein: NEGATIVE
RBC,UR: <1 /HPF (ref 0–5)
Specific Gravity: 1.006 (ref 1.003–1.030)

## 2012-02-24 LAB — BASIC METABOLIC PANEL
Anion Gap: 9 (ref 7–16)
BUN: 22 mg/dL — ABNORMAL HIGH (ref 7–18)
Calcium, Total: 8.3 mg/dL — ABNORMAL LOW (ref 8.5–10.1)
Chloride: 98 mmol/L (ref 98–107)
Creatinine: 1.01 mg/dL (ref 0.60–1.30)
EGFR (African American): >60
EGFR (Non-African Amer.): 52 — ABNORMAL LOW
Osmolality: 269 (ref 275–301)
Potassium: 3.6 mmol/L (ref 3.5–5.1)
Sodium: 133 mmol/L — ABNORMAL LOW (ref 136–145)

## 2012-02-24 LAB — CBC WITH DIFFERENTIAL/PLATELET
Basophil %: 0.2 %
Eosinophil %: 0.6 %
HGB: 8.6 g/dL — ABNORMAL LOW (ref 12.0–16.0)
Lymphocyte #: 0.7 10*3/uL — ABNORMAL LOW (ref 1.0–3.6)
Neutrophil #: 4.3 10*3/uL (ref 1.4–6.5)
Platelet: 174 10*3/uL (ref 150–440)

## 2012-02-25 LAB — CBC WITH DIFFERENTIAL/PLATELET
Basophil #: 0 10*3/uL (ref 0.0–0.1)
Eosinophil #: 0 10*3/uL (ref 0.0–0.7)
HCT: 27.5 % — ABNORMAL LOW (ref 35.0–47.0)
HGB: 9.1 g/dL — ABNORMAL LOW (ref 12.0–16.0)
Lymphocyte #: 0.4 10*3/uL — ABNORMAL LOW (ref 1.0–3.6)
Lymphocyte %: 7.5 %
MCH: 32.7 pg (ref 26.0–34.0)
MCHC: 33.1 g/dL (ref 32.0–36.0)
Monocyte #: 0.3 x10 3/mm (ref 0.2–0.9)
Neutrophil #: 4.9 10*3/uL (ref 1.4–6.5)
Neutrophil %: 86.2 %
RBC: 2.78 10*6/uL — ABNORMAL LOW (ref 3.80–5.20)
RDW: 13.8 % (ref 11.5–14.5)

## 2012-02-25 LAB — BASIC METABOLIC PANEL
Calcium, Total: 8.3 mg/dL — ABNORMAL LOW (ref 8.5–10.1)
Chloride: 99 mmol/L (ref 98–107)
Co2: 25 mmol/L (ref 21–32)
Creatinine: 0.98 mg/dL (ref 0.60–1.30)
EGFR (African American): >60
Osmolality: 276 (ref 275–301)
Potassium: 3.5 mmol/L (ref 3.5–5.1)
Sodium: 136 mmol/L (ref 136–145)

## 2012-03-02 ENCOUNTER — Emergency Department: Payer: Self-pay | Admitting: Internal Medicine

## 2012-03-02 LAB — TROPONIN I: Troponin-I: <0.02 ng/mL

## 2012-03-02 LAB — COMPREHENSIVE METABOLIC PANEL
Albumin: 3.1 g/dL — ABNORMAL LOW (ref 3.4–5.0)
Alkaline Phosphatase: 232 U/L — ABNORMAL HIGH (ref 50–136)
Anion Gap: 9 (ref 7–16)
Bilirubin,Total: 1.3 mg/dL — ABNORMAL HIGH (ref 0.2–1.0)
Chloride: 98 mmol/L (ref 98–107)
Creatinine: 1.02 mg/dL (ref 0.60–1.30)
Glucose: 155 mg/dL — ABNORMAL HIGH (ref 65–99)
SGOT(AST): 28 U/L (ref 15–37)
SGPT (ALT): 29 U/L (ref 12–78)
Total Protein: 7 g/dL (ref 6.4–8.2)

## 2012-03-02 LAB — CBC
HCT: 31.7 % — ABNORMAL LOW (ref 35.0–47.0)
HGB: 10.4 g/dL — ABNORMAL LOW (ref 12.0–16.0)
MCHC: 32.8 g/dL (ref 32.0–36.0)
RBC: 3.2 10*6/uL — ABNORMAL LOW (ref 3.80–5.20)
RDW: 13.8 % (ref 11.5–14.5)

## 2012-03-22 ENCOUNTER — Ambulatory Visit: Payer: Self-pay | Admitting: Internal Medicine

## 2012-04-09 ENCOUNTER — Emergency Department: Payer: Self-pay | Admitting: Emergency Medicine

## 2012-04-09 LAB — CBC
HCT: 23.6 % — ABNORMAL LOW (ref 35.0–47.0)
HGB: 7.9 g/dL — ABNORMAL LOW (ref 12.0–16.0)
MCH: 33.7 pg (ref 26.0–34.0)
MCHC: 33.4 g/dL (ref 32.0–36.0)
MCV: 101 fL — ABNORMAL HIGH (ref 80–100)
RBC: 2.34 10*6/uL — ABNORMAL LOW (ref 3.80–5.20)
RDW: 15.8 % — ABNORMAL HIGH (ref 11.5–14.5)

## 2012-04-09 LAB — BASIC METABOLIC PANEL
Anion Gap: 7 (ref 7–16)
BUN: 31 mg/dL — ABNORMAL HIGH (ref 7–18)
Calcium, Total: 8 mg/dL — ABNORMAL LOW (ref 8.5–10.1)
Chloride: 103 mmol/L (ref 98–107)
Creatinine: 1.1 mg/dL (ref 0.60–1.30)
EGFR (Non-African Amer.): 47 — ABNORMAL LOW
Glucose: 135 mg/dL — ABNORMAL HIGH (ref 65–99)
Potassium: 3.8 mmol/L (ref 3.5–5.1)
Sodium: 136 mmol/L (ref 136–145)

## 2012-04-24 DIAGNOSIS — I4891 Unspecified atrial fibrillation: Secondary | ICD-10-CM

## 2012-04-24 DIAGNOSIS — K210 Gastro-esophageal reflux disease with esophagitis: Secondary | ICD-10-CM

## 2012-04-24 DIAGNOSIS — I5022 Chronic systolic (congestive) heart failure: Secondary | ICD-10-CM

## 2012-04-24 DIAGNOSIS — J45909 Unspecified asthma, uncomplicated: Secondary | ICD-10-CM

## 2012-05-23 ENCOUNTER — Telehealth: Payer: Self-pay | Admitting: Family Medicine

## 2012-05-23 NOTE — Telephone Encounter (Signed)
[facility] Triage Call Report Triage Record Num: [id number] Operator: Alphonsa Overall Patient Name: Trinh Sanjose Call Date & Time: 05/23/2012 12:39:40AM Patient Phone: [phone] PCP: Tillman Abide Patient Gender: Female PCP Fax : [phone] Patient DOB: 1928-01-17 [facility] Name[facility] Reason for Call: Lanora Manis RN [facility] calling about abdominal discomfort. Onset 05/22/12. Abdominal tenderness, distention. Afebrile. 98.3ax at 0035. Vitals WNL. Not able to pass full stream. Denies pain on urination. No urination for >12 hours. Standing order for urinary retention, insert foley, U/A, C&S, facility to f/u with provider in am. Care advice given per Urinary symptoms Protocol. Protocol(s) Used: Urinary Symptoms - Female Recommended Outcome per Protocol: See Provider within 4 hours Reason for Outcome: No urination for 12 or more hours Care Advice: ~ Tell provider medical history of renal disease; especially if have only one kidney. Avoid fluids containing caffeine (coffee, tea, cola or chocolate), alcohol, or citrus juice because they may irritate the bladder. Use of tobacco can also be a bladder irritant. ~ ~ Limit fluid intake to sips, not to exceed eight ounces per hour. ~ Call provider immediately if having severe pain from the retention of urine. ~ SYMPTOM / CONDITION MANAGEMENT ~ List, or take, all current prescription(s), nonprescription or alternative medication(s) to provider for evaluation. Systemic Inflammatory Response Syndrome (SIRS): Watch for signs of a generalized, whole body infection. Occurs within days of a localized infection, especially of the urinary, GI, respiratory or nervous systems; or after a traumatic injury or invasive procedure. - Call EMS 911 if symptoms have worsened, such as increasing confusion or unusual drowsiness; cold and clammy skin; no urine output; rapid respiration (>30/min.) or slow respiration (<10/min.); struggling to breathe. -  Go to the ED immediately for early symptoms of rapid pulse >90/min. or rapid breathing >20/min. at rest; chills; oral temperature >100.4 F (38 C) or <96.8 F (36 C) when associated with conditions noted. ~ 05/23/2012 12:54:16AM Page 1 of 1 [email or web address]

## 2012-05-23 NOTE — Telephone Encounter (Signed)
Dr Dayton Martes in facility today---should be able to review status

## 2012-05-26 DIAGNOSIS — S40029A Contusion of unspecified upper arm, initial encounter: Secondary | ICD-10-CM

## 2012-05-26 DIAGNOSIS — I4891 Unspecified atrial fibrillation: Secondary | ICD-10-CM

## 2012-05-26 DIAGNOSIS — I5022 Chronic systolic (congestive) heart failure: Secondary | ICD-10-CM

## 2012-05-26 DIAGNOSIS — J45909 Unspecified asthma, uncomplicated: Secondary | ICD-10-CM

## 2012-06-02 ENCOUNTER — Telehealth: Payer: Self-pay | Admitting: Family Medicine

## 2012-06-02 NOTE — Telephone Encounter (Signed)
I took care of this issue at [facility] this AM

## 2012-06-02 NOTE — Telephone Encounter (Signed)
[facility] Triage Call Report Triage Record Num: [id number] Operator: Patriciaann Clan Patient Name: Anne [name] Call Date & Time: 05/31/2012 7:46:47AM Patient Phone: [phone] PCP: Tillman Abide Patient Gender: Female PCP Fax : [phone] Patient DOB: 1927-09-08 [facility] Name[facility] Reason for Call: Caller: Elizabeth/RN calling from [facility]; PCP: Tillman Abide [facility]); CB#: [phone]; Call regarding Need orders; Lanora Manis RN states patient has history of urinary retention. States patient is unable to completely empty her bladder since 05/30/12. States patient is "dribbling" urine. Denies burning with urination. Afebrile. States patient's bladder is distended. Temp. 98.7, pulse 80, respirations 20, blood pressure 120/78. Triage per Urinary Sx Protocol. Disposition obtained of " See Provider within 4 hours" related to positive triage assessment for " No urination for 12 hours or more." Care advice given per guidelines and standing orders. Lanora Manis RN advised to insert foley catheter, monitor Intake and output. Advised to obtain urinalysis and C&S. Call back parameters reviewed. Elizabeth RN verbalizes understanding of orders. [id number]: Dr. Loreen Freud notified of above. Protocol(s) Used: Urinary Symptoms - Female Recommended Outcome per Protocol: See Provider within 4 hours Override Outcome if Used in Protocol: Call Provider Immediately RN Reason for Override Outcome: Physician Orders. Reason for Outcome: No urination for 12 or more hours Care Advice: ~ SYMPTOM / CONDITION MANAGEMENT 05/31/2012 8:30:48AM Page 1 of 1 [email or web address]_[email or web address]_V2

## 2012-06-12 DIAGNOSIS — I5023 Acute on chronic systolic (congestive) heart failure: Secondary | ICD-10-CM

## 2012-06-12 DIAGNOSIS — R079 Chest pain, unspecified: Secondary | ICD-10-CM

## 2012-06-26 DIAGNOSIS — F329 Major depressive disorder, single episode, unspecified: Secondary | ICD-10-CM

## 2012-06-26 DIAGNOSIS — J45909 Unspecified asthma, uncomplicated: Secondary | ICD-10-CM

## 2012-06-26 DIAGNOSIS — I5022 Chronic systolic (congestive) heart failure: Secondary | ICD-10-CM

## 2012-06-26 DIAGNOSIS — I4891 Unspecified atrial fibrillation: Secondary | ICD-10-CM

## 2012-07-08 DIAGNOSIS — J441 Chronic obstructive pulmonary disease with (acute) exacerbation: Secondary | ICD-10-CM

## 2012-07-22 DIAGNOSIS — J441 Chronic obstructive pulmonary disease with (acute) exacerbation: Secondary | ICD-10-CM

## 2012-07-23 ENCOUNTER — Ambulatory Visit: Payer: Medicare Other | Admitting: Internal Medicine

## 2012-07-31 DIAGNOSIS — F411 Generalized anxiety disorder: Secondary | ICD-10-CM

## 2012-08-25 DIAGNOSIS — M25569 Pain in unspecified knee: Secondary | ICD-10-CM

## 2012-10-09 DIAGNOSIS — J45909 Unspecified asthma, uncomplicated: Secondary | ICD-10-CM

## 2012-10-09 DIAGNOSIS — I5022 Chronic systolic (congestive) heart failure: Secondary | ICD-10-CM

## 2012-10-09 DIAGNOSIS — F329 Major depressive disorder, single episode, unspecified: Secondary | ICD-10-CM

## 2012-10-09 DIAGNOSIS — I4891 Unspecified atrial fibrillation: Secondary | ICD-10-CM

## 2012-10-15 IMAGING — CT CT HEAD WITHOUT CONTRAST
2 series · 16 of 30 positions shown, 20 images · non-contrast
Comparison: none

REASON FOR EXAM: confusion
COMMENTS:

PROCEDURE:     CT  - CT HEAD WITHOUT CONTRAST  - May 29, 2011 [DATE]
RESULT:     Head CT dated 05/29/2011.
TECHNIQUE: Helical noncontrasted 5 mm sections were obtained from skull base
to the vertex. Rest made to prior study dated 05/07/2011.

[Series 2: without · axial · non-contrast · 0.43mm/px · z∈[-264,-138]mm · 13 of 31 slices shown, 17 images]
[im 3/31  brain]
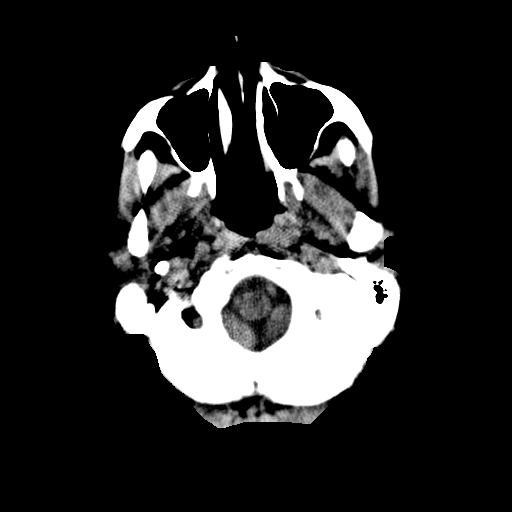
[im 3/31  bone]
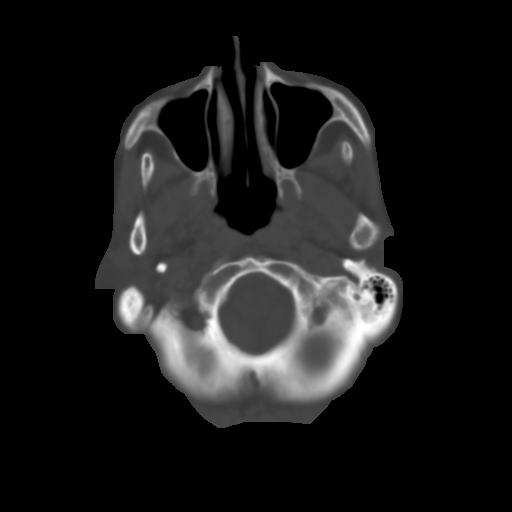
[im 5/31  brain]
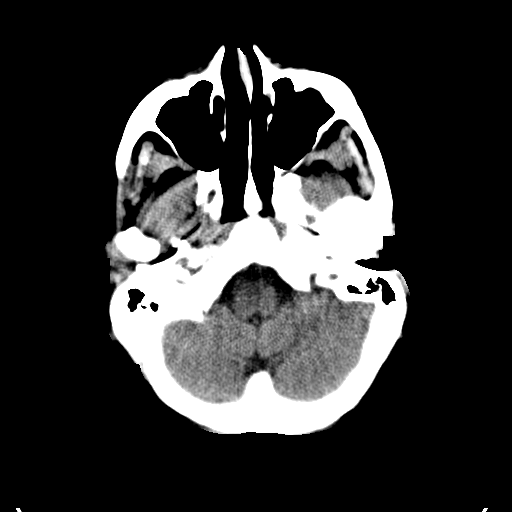
[im 7/31  brain]
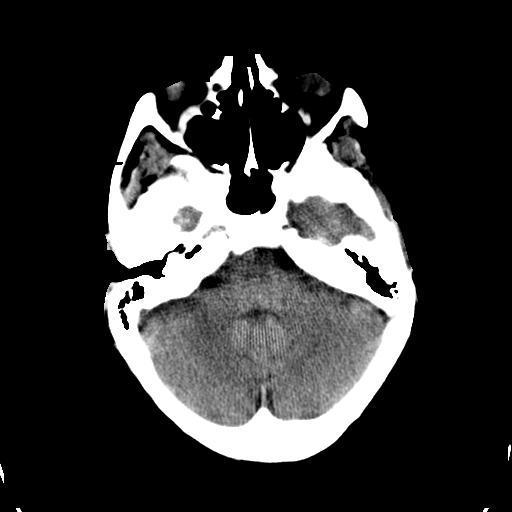
[im 9/31  brain]
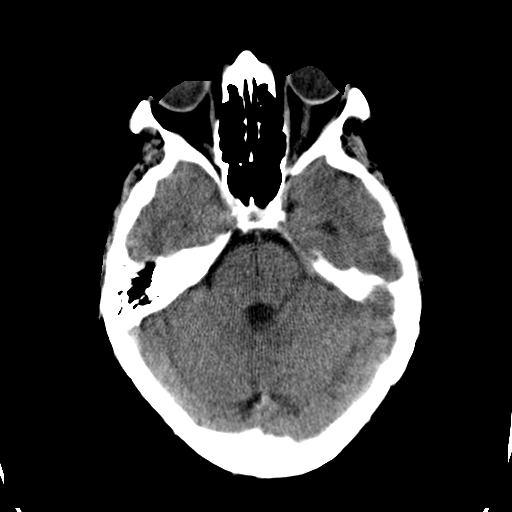
[im 11/31  brain]
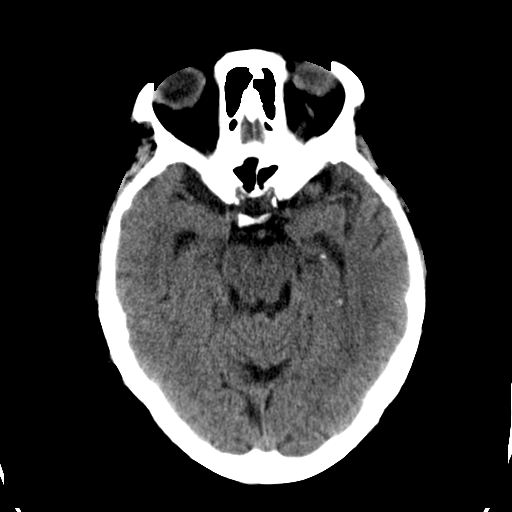
[im 11/31  bone]
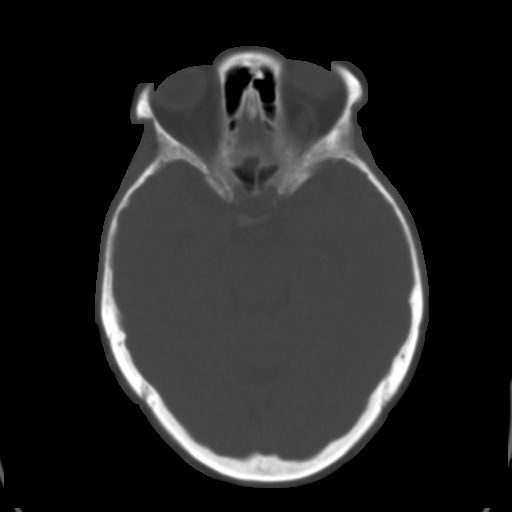
[im 13/31  brain]
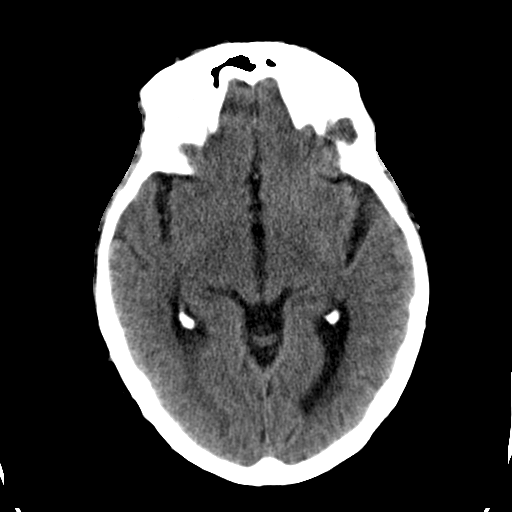
[im 16/31  brain]
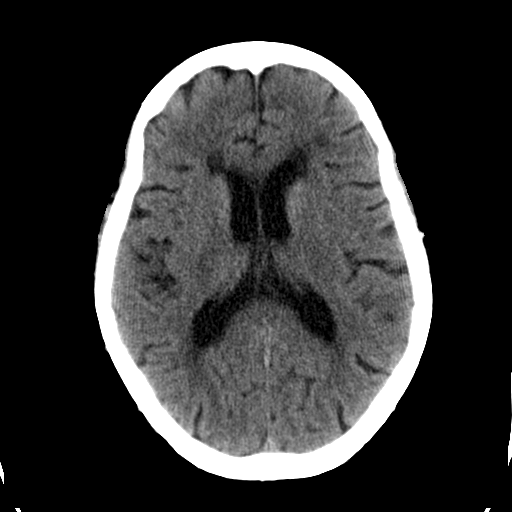
[im 18/31  brain]
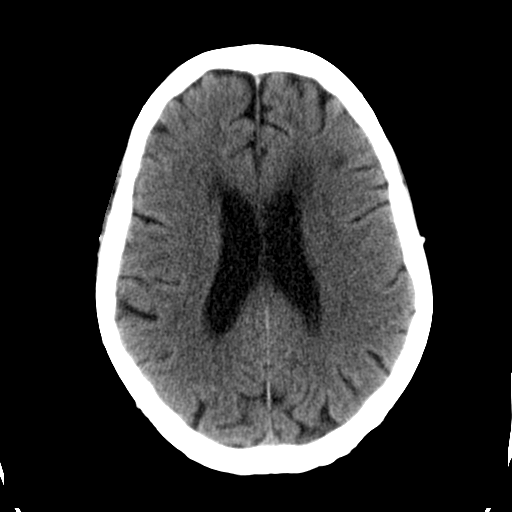
[im 20/31  brain]
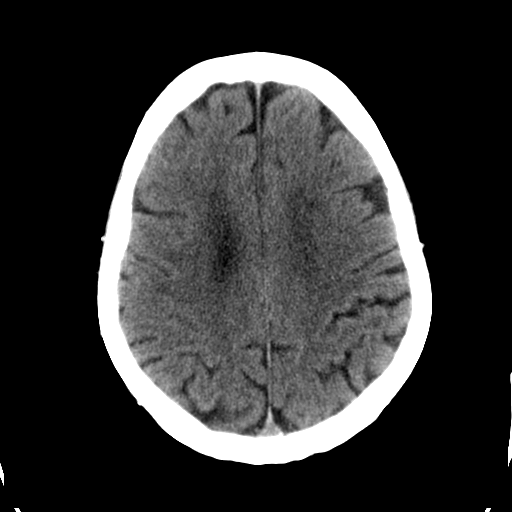
[im 20/31  bone]
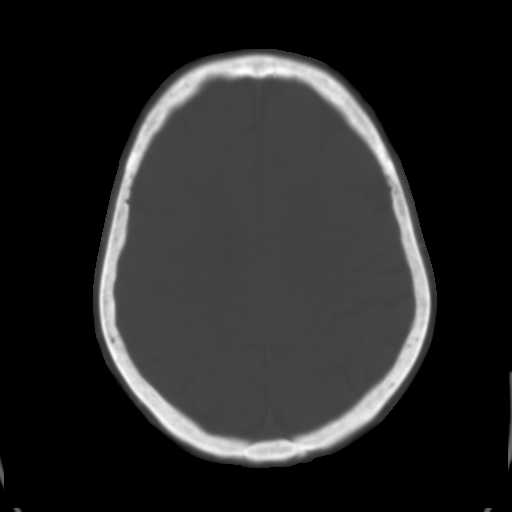
[im 22/31  brain]
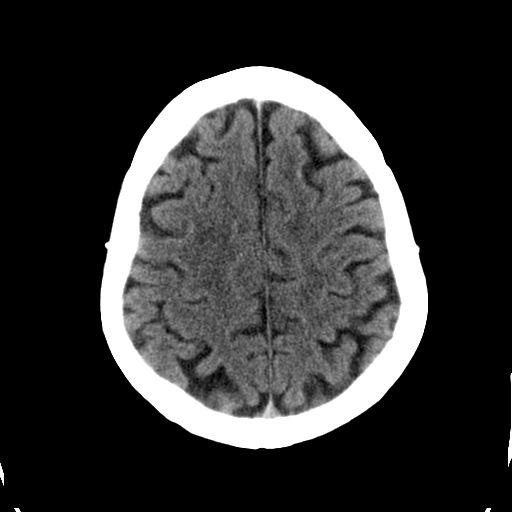
[im 24/31  brain]
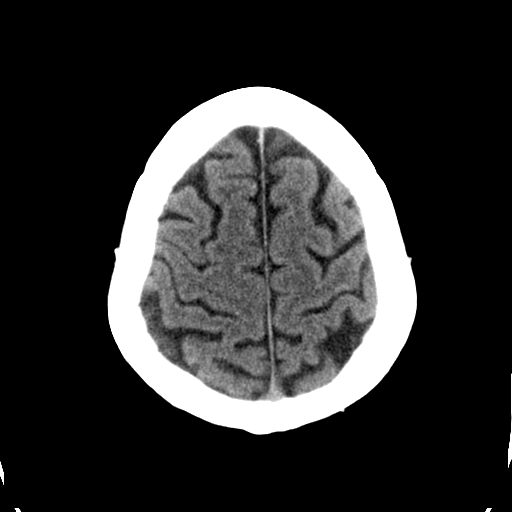
[im 26/31  brain]
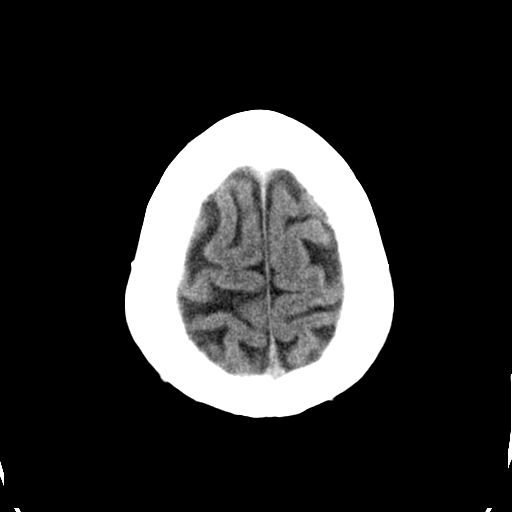
[im 28/31  brain]
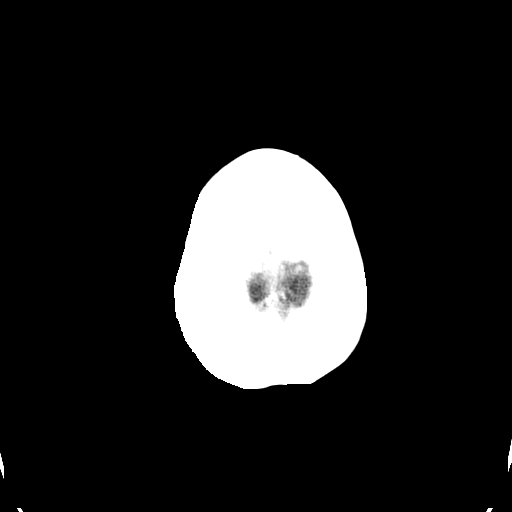
[im 28/31  bone]
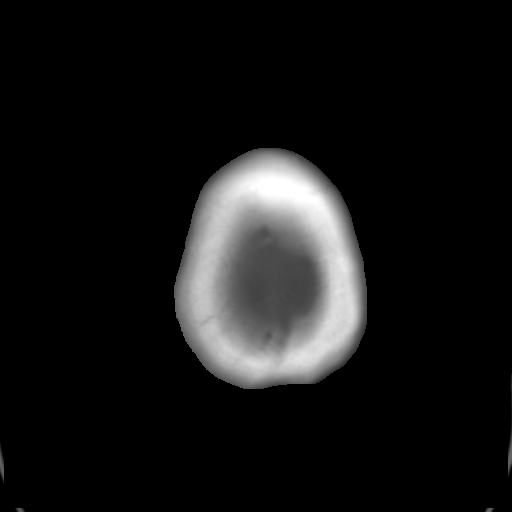

[Series 3: bone · axial · 0.44mm/px · z∈[-264,-224]mm · 3 of 31 slices shown]
[im 3/31  bone]
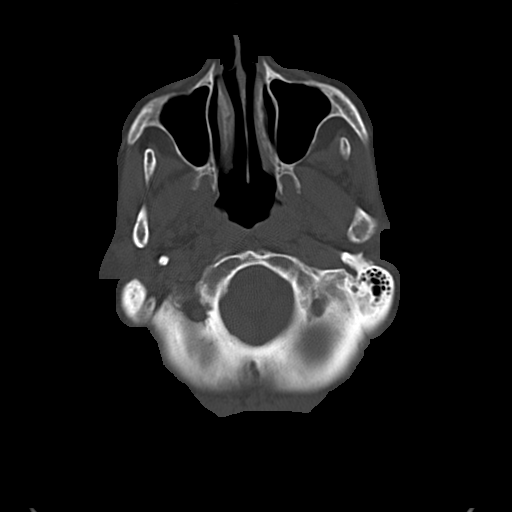
[im 7/31  bone]
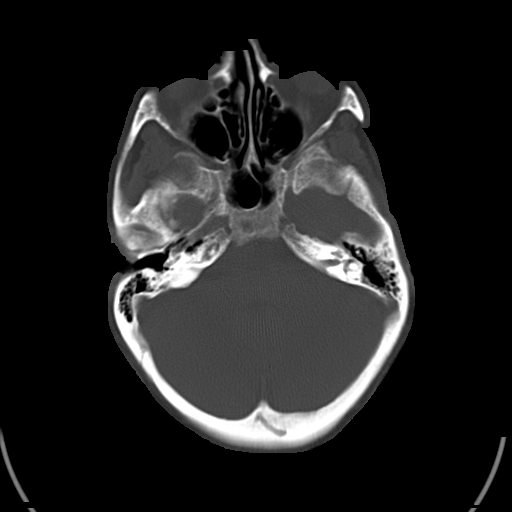
[im 11/31  bone]
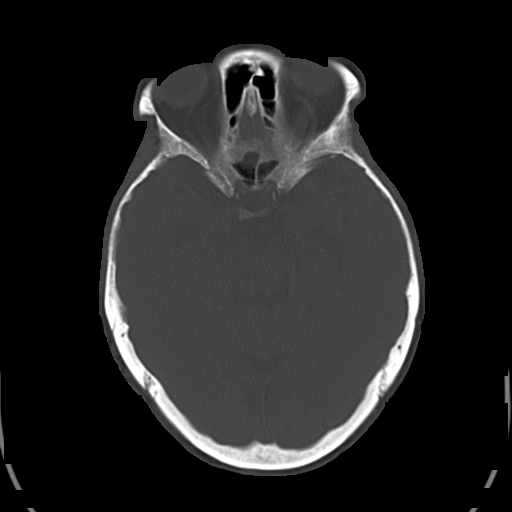

[16 of 30 positions shown; findings below may reference images not displayed]

FINDINGS: There is no evidence of intra-axial nor extra-axial fluid
collections no evidence of acute hemorrhage. There is low attenuation
project within the deep and periventricular white matter regions. There is
no evidence of mass effect. Ventricles and cisterns are patent. The
cerebellum is unremarkable. The paranasal sinuses, visualized portions,
well-aerated appear the mastoid air cells are unremarkable.
IMPRESSION: Involutional changes without evidence of focal acute
abnormalities.

## 2012-10-16 DIAGNOSIS — K224 Dyskinesia of esophagus: Secondary | ICD-10-CM

## 2012-10-21 DIAGNOSIS — J449 Chronic obstructive pulmonary disease, unspecified: Secondary | ICD-10-CM

## 2012-10-21 DIAGNOSIS — I2510 Atherosclerotic heart disease of native coronary artery without angina pectoris: Secondary | ICD-10-CM

## 2012-10-21 DIAGNOSIS — S8010XA Contusion of unspecified lower leg, initial encounter: Secondary | ICD-10-CM

## 2012-10-21 DIAGNOSIS — I5020 Unspecified systolic (congestive) heart failure: Secondary | ICD-10-CM

## 2012-10-31 DIAGNOSIS — I5030 Unspecified diastolic (congestive) heart failure: Secondary | ICD-10-CM

## 2012-11-06 DIAGNOSIS — I5023 Acute on chronic systolic (congestive) heart failure: Secondary | ICD-10-CM

## 2012-11-14 DIAGNOSIS — I5030 Unspecified diastolic (congestive) heart failure: Secondary | ICD-10-CM

## 2012-11-16 IMAGING — CT CT HEAD WITHOUT CONTRAST
2 series · 16 of 30 positions shown, 20 images · non-contrast
Comparison: none

REASON FOR EXAM: fall, head laceration
COMMENTS:

PROCEDURE:     CT  - CT HEAD WITHOUT CONTRAST  - June 30, 2011  [DATE]
RESULT:     Comparison is made to the previous examination 31 May, 2011.

[Series 2: without · axial · non-contrast · 0.41mm/px · z∈[-134,-4]mm · 13 of 32 slices shown, 17 images]
[im 3/32  brain]
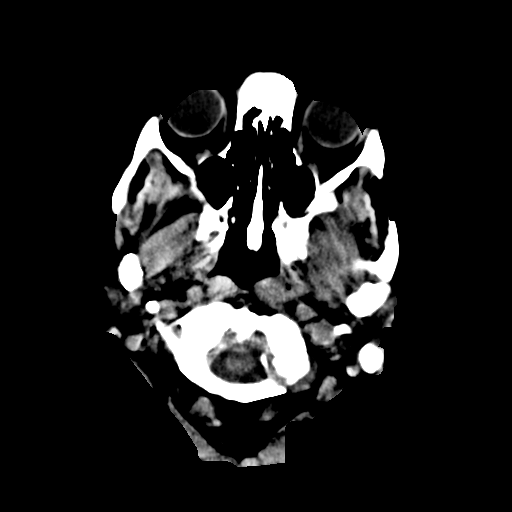
[im 3/32  bone]
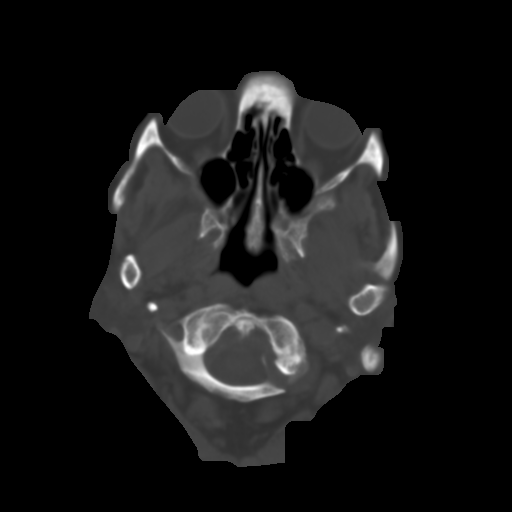
[im 5/32  brain]
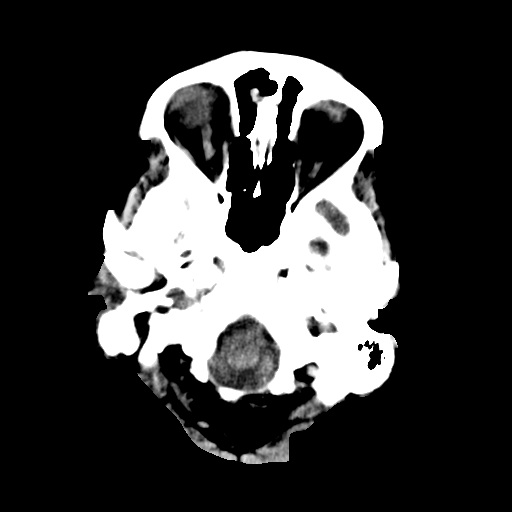
[im 7/32  brain]
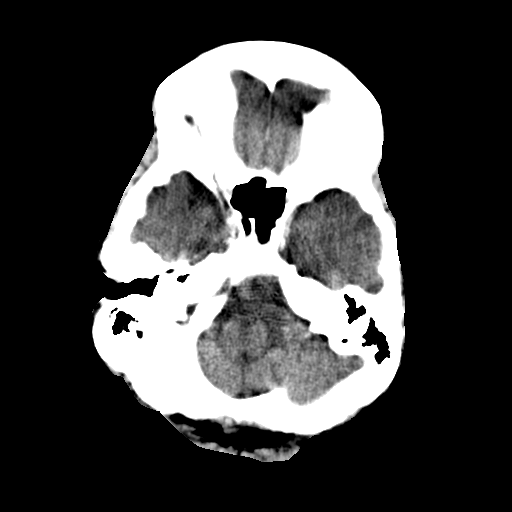
[im 9/32  brain]
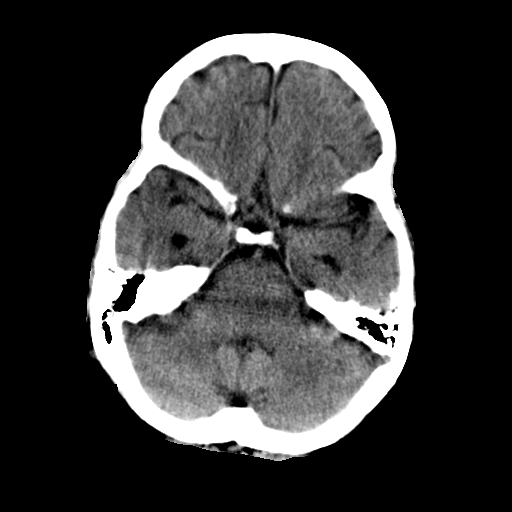
[im 12/32  brain]
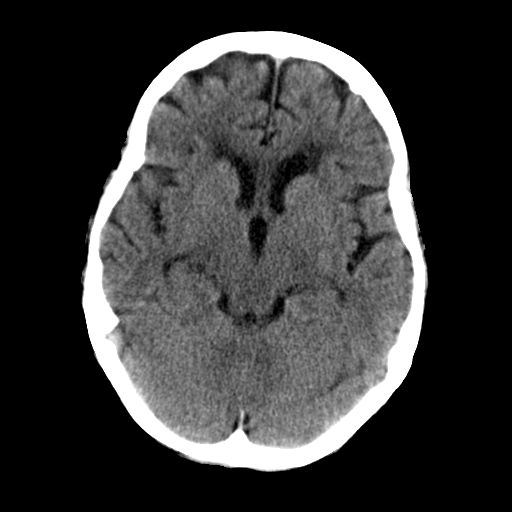
[im 12/32  bone]
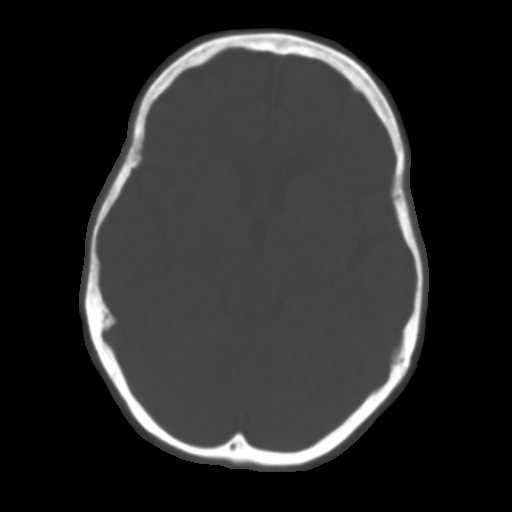
[im 14/32  brain]
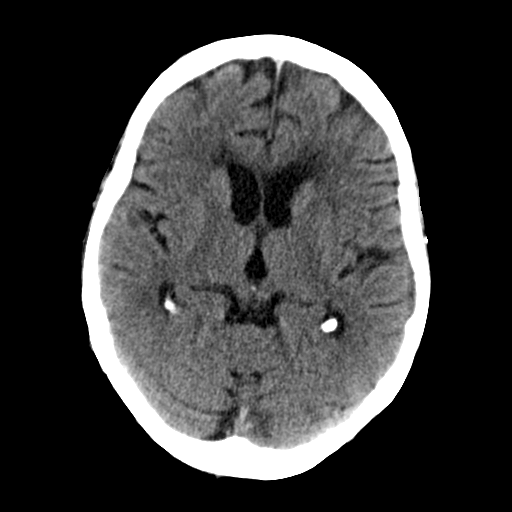
[im 16/32  brain]
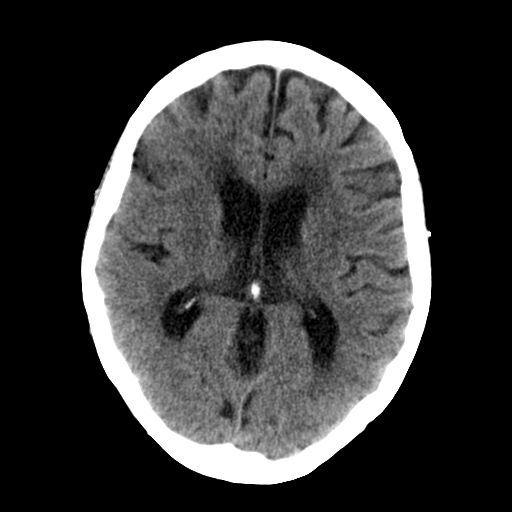
[im 18/32  brain]
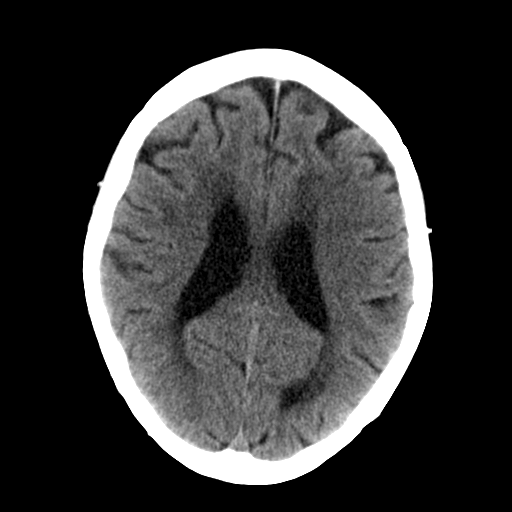
[im 20/32  brain]
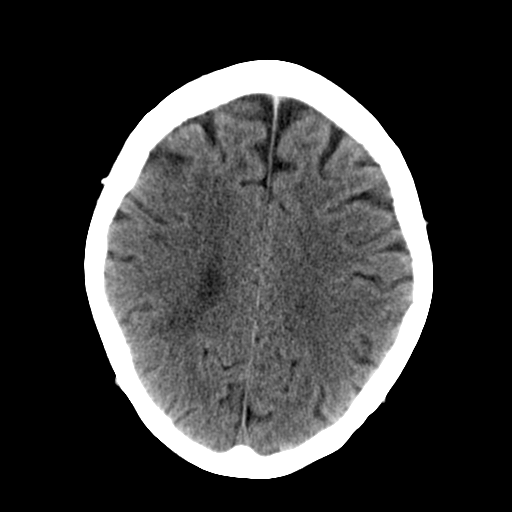
[im 20/32  bone]
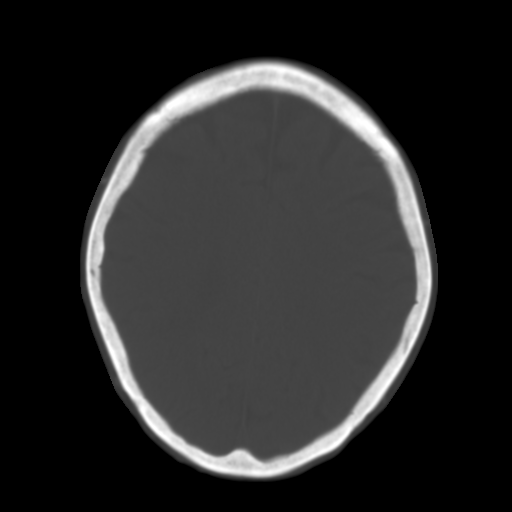
[im 23/32  brain]
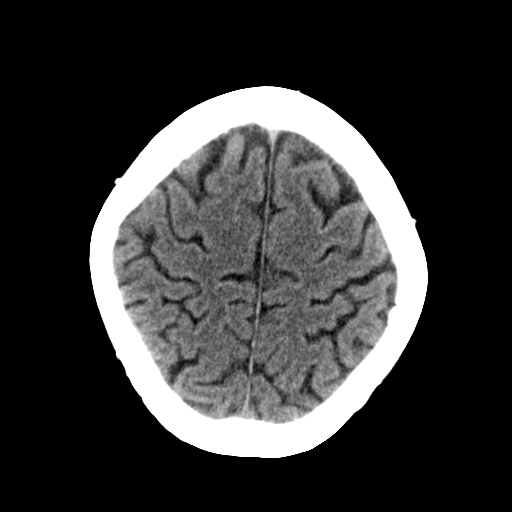
[im 25/32  brain]
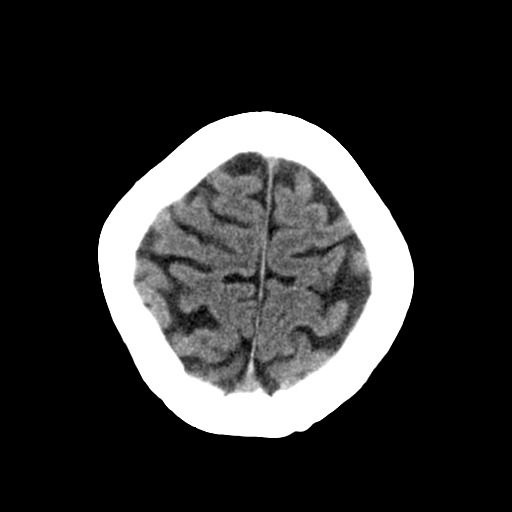
[im 27/32  brain]
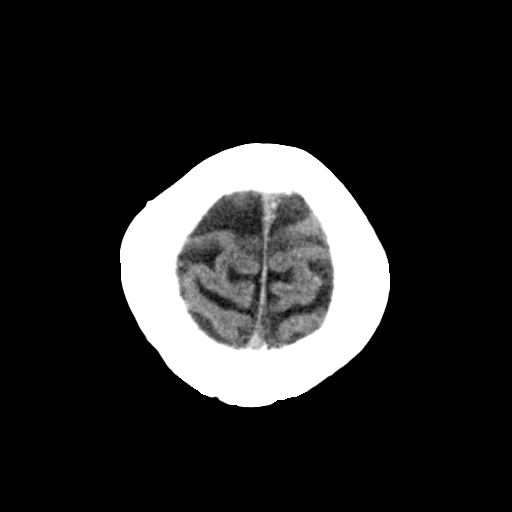
[im 29/32  brain]
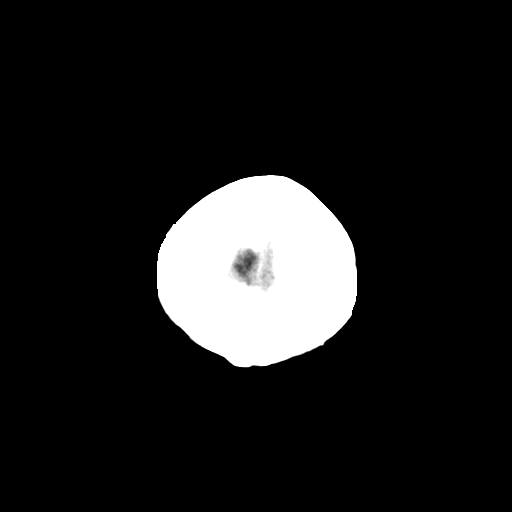
[im 29/32  bone]
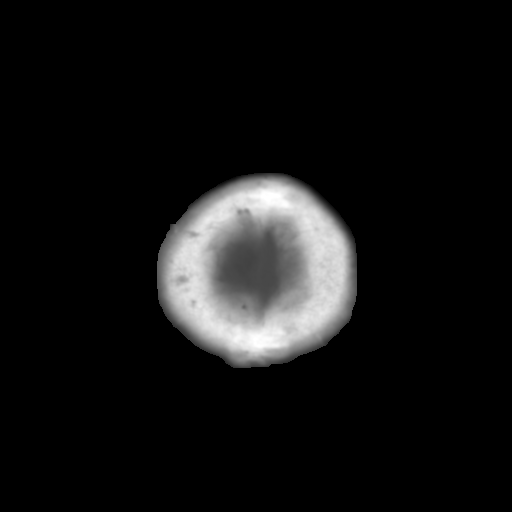

[Series 3: bone · axial · 0.41mm/px · z∈[-134,-88]mm · 3 of 32 slices shown]
[im 3/32  bone]
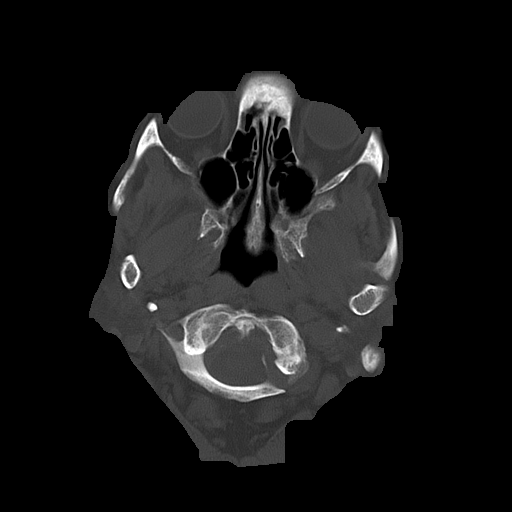
[im 7/32  bone]
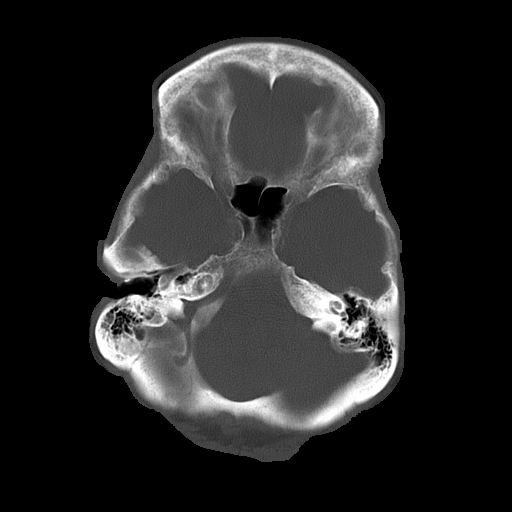
[im 12/32  bone]
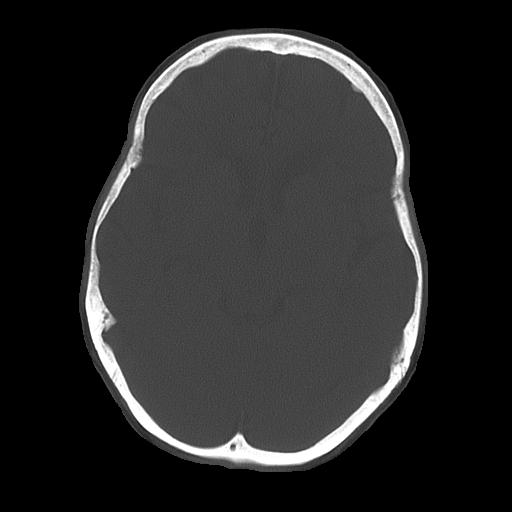

[16 of 30 positions shown; findings below may reference images not displayed]

FINDINGS: There is mild prominence of the ventricles and sulci consistent
with atrophy appropriate for the patient's age. There is extensive low
attenuation within the periventricular and subcortical white matter most
consistent with chronic small vessel ischemic disease. There is no evidence
of intracranial hemorrhage, mass or mass effect. No large territorial
infarct is evident. The appearance is stable. The paranasal sinuses and
mastoid air cells appear within normal limits. The calvarium is intact.
IMPRESSION: 1.     Changes of atrophy with chronic small vessel ischemic disease.
2.     No acute intracranial abnormality evident.

[REDACTED]

## 2012-11-18 DIAGNOSIS — L03119 Cellulitis of unspecified part of limb: Secondary | ICD-10-CM

## 2012-11-18 DIAGNOSIS — L02419 Cutaneous abscess of limb, unspecified: Secondary | ICD-10-CM

## 2012-11-19 DIAGNOSIS — I509 Heart failure, unspecified: Secondary | ICD-10-CM

## 2012-11-19 DIAGNOSIS — I4891 Unspecified atrial fibrillation: Secondary | ICD-10-CM

## 2012-11-19 DIAGNOSIS — M1990 Unspecified osteoarthritis, unspecified site: Secondary | ICD-10-CM

## 2012-11-19 DIAGNOSIS — E039 Hypothyroidism, unspecified: Secondary | ICD-10-CM

## 2012-11-19 DIAGNOSIS — F329 Major depressive disorder, single episode, unspecified: Secondary | ICD-10-CM

## 2012-11-19 DIAGNOSIS — N052 Unspecified nephritic syndrome with diffuse membranous glomerulonephritis: Secondary | ICD-10-CM

## 2012-12-09 DIAGNOSIS — F411 Generalized anxiety disorder: Secondary | ICD-10-CM

## 2012-12-09 DIAGNOSIS — M159 Polyosteoarthritis, unspecified: Secondary | ICD-10-CM

## 2012-12-12 ENCOUNTER — Telehealth: Payer: Self-pay | Admitting: Family Medicine

## 2012-12-12 NOTE — Telephone Encounter (Signed)
Sherry from hospice requesting RX for morphine 20mg /ml.  Sig: give 0.25-0.5 q1h PRN for pain.  Cb [phone].

## 2012-12-13 NOTE — Telephone Encounter (Signed)
Rx written at [facility]

## 2012-12-20 DEATH — deceased

## 2014-06-08 NOTE — Consult Note (Signed)
Pt EGD showed severe spasm of esophagus with a mild Schatzki ring. The spasm was so severe it made passage of the scope somewhat difficult This was dilated with a 30F Savary dilator over a guide wire. Will discuss with family.  Warm liquids and pureed diet are recommended.   Electronic Signatures: Manya Silvas (MD)  (Signed on 05-Dec-13 19:14)  Authored  Last Updated: 05-Dec-13 19:14 by Manya Silvas (MD)

## 2014-06-08 NOTE — Consult Note (Signed)
Discussed with patient and family.  She has dementia and hallucinations at times. She has times of more lucency.  Swallowing well as long as she stays off cold things.bowels well.  Appetite down.  I agree with placement even though she espresses wishes not to go.  She is a hazard to herself and her dwelling.  I will sign off, reconsult if needed.  She has had microscopic colitis in the past and if she develops diarrhea she should be treated with entocort 9mg  a day.  Electronic Signatures: Manya Silvas (MD)  (Signed on 10-Dec-13 11:37)  Authored  Last Updated: 10-Dec-13 11:37 by Manya Silvas (MD)

## 2014-06-08 NOTE — Discharge Summary (Signed)
PATIENT NAME:  Anne [name], Anne [name] MR#:  [id number] DATE OF BIRTH:  18-Feb-1928  DATE OF ADMISSION:  01/23/2012 DATE OF DISCHARGE:  01/29/2012   PRIMARY CARE PHYSICIAN: Apolonio Schneiders, MD   FINAL DIAGNOSES:  1. Weakness.  2. Urinary retention requiring Foley placement.  3. Dysphagia and dysmotility.  4. Dehydration.  5. Acute on chronic renal failure.  6. Cardiomyopathy with history of congestive heart failure. No signs of congestive heart failure on this hospital stay.  7. Skin maceration.  8. Oral thrush.  9. Hypothyroidism.  10. Gastroesophageal reflux disease.  11. Hyperlipidemia.  12. Chronic obstructive pulmonary disease.  13. History of atrial fibrillation.  14. Non-Hodgkin's lymphoma.  15. Anemia.   MEDICATIONS ON DISCHARGE:  1. Tylenol 650 mg every four hours as needed for pain.  2. Nitrostat 0.4 mg sublingual every five minutes as needed for chest pain.  3. Levothyroxine 137 mcg daily.  4. Advair Diskus 250/50 one inhalation twice a day. 5. Aspirin 81 mg daily. 6. Fluticasone 50 mcg per nasal spray one spray each nostril daily.  7. Metoprolol tartrate 25 mg half a tablet twice a day.  8. Spiriva 1 inhalation daily.  9. Imdur 60 mg daily.  10. Senna Plus 1 tablet twice a day. 11. Protonix 40 mg daily.  12. Wellbutrin 300 mg at bedtime.  13. Librax 5/2.5 one tablet 3 times a day as needed.  14. Vitamin B12 100 mcg daily.  15. Magnesium hydroxide 30 mL daily as needed for constipation.  16. Pravastatin 40 mg at bedtime.  17. Torsemide 20 mg daily.  18. Potassium chloride 10 mEq daily.  19. Nystatin diphenhydramine mouthwash AC, which is Duke's Mouthwash, 15 mL every eight hours for five more days.   DIET: Low sodium, carbohydrate-controlled diet, pureed with warm liquids, nothing cold.   TREATMENTS: Foley to leg bag   REFERRAL: Physical therapy    REFERRAL: Follow-up with doctor at rehab in 1 to 2 days.   REASON FOR ADMISSION: The patient was admitted discharged  01/23/2012 complaining of not feeling well, not able to swallow, occasional nausea and vomiting. She was admitted for dysphagia. GI consultation was obtained from Dr. Vira Agar. For her dehydration and acute on chronic renal failure, the patient was started on IV fluid hydration. Medications that can affect kidney function were held. For urinary retention, a Foley catheter was placed.   [place] COURSE:  1. Dr. Gustavo Lah and Dr. Vira Agar from GI  2. Dr. Ermalinda Memos from [place]  3. Physical therapy  4. Dr. Bary Leriche from Psychiatry  5. Dr. Jefm Bryant from [place] COURSE: EKG showed paced 82 beats per minute.   Troponin negative. Lipase 68. Glucose 89, BUN 130, creatinine 2.91, sodium 133, potassium 4.0, chloride 98, CO2 23, calcium 8.9. Liver function tests alkaline phosphatase elevated 231, AST 17, ALT 20, albumin 3.2, white blood cell count 4.3, hemoglobin and hematocrit 9.4 and 28.3, platelet count 156.   Chest x-ray shows enlargement of the cardiac chambers without evidence of heart failure or pneumonia.   Right knee shows no evidence of fracture or degenerative changes on top of osteopenia.   Urinalysis negative. INR on presentation was 1.1.   Ultrasound of the kidneys bilaterally showed no hydronephrosis, cyst bilateral kidneys.   Chest x-ray on the 6th showed no acute disease in the chest.   Last INR on the 7th showed 1.1. Creatinine on the 7th 1.2 with a GFR of 42. Last  white blood cell count was 5.6. Last hemoglobin 9.4. Last platelet count 133.   HOSPITAL COURSE PER PROBLEM LIST:  1. For the patient's weakness, the patient will be going to rehab. Difficulty with turns with muscle weakness and weight of the rolling walker but she was able to walk with a rolling walker with physical therapy contact guard assist and they recommended rehab nursing home placement. The patient absolutely refused to go to  rehab. She was seen by Psychiatry and deemed incompetent to make this decision. The patient will be going to rehab.  2. Urinary retention requiring Foley catheter placement. The patient failed a voiding trial. Catheter was placed, Foley to leg bag. Rehab to help out with the Foley catheter management. Can refer to Urology as outpatient.  3. For the patient's dysphagia, the patient was seen in consultation by GI. The patient had an upper endoscopy done on 01/24/2012 that showed esophageal motility disorder, mild Schatzki's ring which was dilated, normal stomach, normal duodenum. The patient is on Protonix.  4. For the patient's dehydration, the patient was given IV fluid hydration. Her water pills were stopped. The patient's creatinine improved down to 1.2.  5. Acute on chronic renal failure. The patient was dehydrated on admission, given IV fluids during the hospital stay. Restarted on low dose torsemide. Metolazone was stopped. Recommend checking a BMP in one week.  6. For the patient's cardiomyopathy with history of congestive heart failure, there were no signs of congestive heart failure on this hospital stay. The patient was actually dehydrated and given IV fluid hydration. No ACE inhibitor secondary to worsening renal function. The patient is on metoprolol and torsemide.  7. For the patient's skin maceration probably from the urinary incontinence, Foley may help out with improvement of skin. Also, the patient needs to mobilize better.  8. Oral thrush. The patient was given Duke's mouthwash.  9. Hypothyroidism. She is on levothyroxine.  10. Gastroesophageal reflux disease. On PPI.  11. Hyperlipidemia. On a statin.  12. COPD. Respiratory status stable on inhalers.  13. For the patient's atrial fibrillation, Coumadin was held. INR was subtherapeutic on admission anyway. The patient is a high fall risk and being transferred to rehab. Risks of Coumadin outweigh the benefits. Higher risk of stroke. Also,  the patient was deemed incompetent to make medical decisions makes Coumadin all more risky at this point. Coumadin was stopped.   TIME SPENT ON DISCHARGE: 40 minutes.   ____________________________ Tana Conch. Leslye Peer, MD rjw:drc D: 01/29/2012 13:19:27 ET T: 01/29/2012 13:45:02 ET JOB#: [id number]  cc: Tana Conch. Leslye Peer, MD, <Dictator> Vianne Bulls. Arline Asp, MD Marisue Brooklyn MD ELECTRONICALLY SIGNED 02/06/2012 15:03

## 2014-06-08 NOTE — Consult Note (Signed)
Pt tol diet well, swallowing better with beverages not cold and pureed food.   She has constipation and rectal exam showed impaction which I broke up with digital manuvers.  Will get fleets enema to see if it can move things out.  May go home soon.  Electronic Signatures: Manya Silvas (MD)  (Signed on 06-Dec-13 19:13)  Authored  Last Updated: 06-Dec-13 19:13 by Manya Silvas (MD)

## 2014-06-08 NOTE — H&P (Signed)
PATIENT NAME:  Anne [name], TOROK MR#:  [id number] DATE OF BIRTH:  1927/11/12  DATE OF ADMISSION:  01/23/2012  PRIMARY CARE PHYSICIAN: Apolonio Schneiders, MD  HISTORY OF PRESENT ILLNESS: The patient is an 81 year old Caucasian female with past medical history significant for history of coronary artery disease, history of congestive heart failure systolic with ejection fraction of 35%, history of non-Hodgkin's lymphoma, and iron deficiency anemia as well as atrial fibrillation who presented to the hospital with complaints of not feeling well. Apparently the patient is not able to swallow. She tells me that she is having difficulty swallowing for a long period of time. She is not able to even calculate how long, but over the past one month her swallowing even worsened. It feels that whenever she tries to swallow that food would stop in the lower sternal area and she would regurgitate with saliva. She is able to drink fluids, however. She tells me that she is able to drink quite a lot. However, sometimes she would have dysphagia symptoms, even with water, and she sometimes is nauseated and vomits. She denies any significant pains in the lower part of her abdomen or chest. She presented to the hospital. She was noted to have acute renal failure and hospitalist services were contacted for admission.   PAST MEDICAL HISTORY:  1. History of atrial fibrillation and congestive heart failure, on Coumadin therapy. 2. History of stage IV non-Hodgkin's lymphoma, diagnosed in 2003.  3. History of iron deficiency anemia requiring iron IV every week, according to Dr. Metro Kung note, in June 2013.  4. History of hyponatremia with admission for the same in April 2013.  5. History of rhabdomyolysis in April 2013. 6. History of coronary artery disease with coronary artery bypass grafting. 7. Permanent pacemaker placement. 8. Congestive heart failure, systolic, with ejection fraction of 35%.  9. History of  hypertension. 10. History of hyperlipidemia. 11. History of depression. 12. History of bilateral knee replacement.  13. History of hypothyroidism.  14. History of anxiety.  15. History of gastroesophageal reflux disease.  16. History of chronic kidney disease. 17. Admission in March 2013 for streptococcal pneumonia as well as bacteremia. 18. History of cardiomyopathy with ejection fraction of 35%.   MEDICATIONS: According to medical records. 1. Advair Diskus 250/50 twice daily.  2. Aspirin 81 mg p.o. daily.  3. Bupropion 150/24 hours two pills at bedtime. 4. Diclofenac 3% topical gel applied topically three times daily as needed.  5. Eplerenone 25 mg p.o. daily.  6. Fluticasone one spray to each nostril daily.  7. Isosorbide mononitrate 60 mg p.o. daily.  8. Levothyroxine 137 mcg p.o. daily.  9. Librax 5/2.5 mg one capsule three times daily as needed.  10. Magnesium hydroxide 30 mL once daily as needed.  11. Metolazone 2.5 mg p.o. daily.  12. Metoprolol tartrate 12.5 mg p.o. twice daily.  13. Nitrostat 0.4 mg sublingual every five minutes as needed.  14. Pantoprazole 40 mg p.o. daily. 15. Potassium chloride 20 milliequivalents p.o. daily. 16. Pravastatin 40 mg p.o. at bedtime.  17. Senna Plus 50/8.6 mg p.o. twice a day. 18. Spiriva 18 mcg inhalation capsule once daily.  19. Torsemide 40 mg p.o. twice daily.  20. Tylenol 325 mg p.o. two tablets every four hours as needed.  21. Vitamin B12 100 mcg p.o. daily.  22. Warfarin 2.5 mg p.o. daily.   ALLERGIES: No known drug allergies.   FAMILY HISTORY: Coronary artery disease in both parents as well as son.  SOCIAL HISTORY: She  lives in [place]. She is retired. No history of smoking, alcohol, or drug abuse.   REVIEW OF SYSTEMS: The patient admits of having some weight loss but not sure. She however was weighed today and she weighed approximately 118, however, her dry weight is 112. She also admits to some chills, some fatigue  and weakness, pains in her right shoulder and knees as well as back, admits of having some cough and shortness of breath intermittently, also some chest pains intermittently whenever she takes a deep breath, also some lower extremity edema which seems to be chronic. Admits of having some abdominal pain in the lower abdomen which she has been having for weeks now. She admits to having some rectal bleeding due to hemorrhoids intermittently, also significant constipation for which she uses Milk of Magnesia as well as senna. Admits of having urinary incontinence, which is urge as well as stress incontinence. Not being able to empty bladder. In the hospital, in the emergency room, she was noted to have a bladder scan with more than 999 mL and Foley catheter is being placed now. CONSTITUTIONAL: Otherwise, denies any fevers, denies any weight gain. EYES: Denies any blurry vision, double vision, glaucoma, or cataracts. ENT: Denies any tinnitus, allergies, epistaxis, sinus pain, dentures, or difficulty swallowing. RESPIRATORY: Denies any cough, wheezes, asthma, or chronic obstructive pulmonary disease. CARDIOVASCULAR: Denies any orthopnea, arrhythmias, palpitations, or syncope. GASTROINTESTINAL: Denies any diarrhea, hematemesis, or change in bowel habits. GENITOURINARY: Denies dysuria, hematuria, or frequency. ENDOCRINE: Denies any polydipsia, nocturia, thyroid problems, heat or cold intolerance, or thirst. HEMATOLOGIC: Denies anemia, easy bruising, bleeding, or swollen glands.  SKIN: Denies any acne, rashes, lesions, or change in moles. MUSCULOSKELETAL: Denies arthritis, cramps, or swelling. NEUROLOGIC: No numbness, epilepsy, or tremor. PSYCHIATRY: Denies anxiety, insomnia, or depression.   PHYSICAL EXAMINATION:   VITAL SIGNS: On arrival to the hospital, temperature is 97.8, pulse 51, respiration rate 16, blood pressure 119/49, and saturation 99% on room air.   GENERAL: This is a well-developed, well-nourished  Caucasian female in mild distress, lying on the stretcher.   HEENT: Her pupils are equal and reactive to light. Extraocular movements are intact. No icterus or conjunctivitis. Has normal hearing. No pharyngeal erythema. Mucosa is dry.   NECK: No masses. Supple and nontender. Thyroid is not enlarged. No adenopathy and no JVD or carotid bruits bilaterally. Full range of motion.   PULMONARY: Lungs are clear to auscultation in all fields. No rales, rhonchi, diminished breath sounds, or wheezing. No labored inspirations, increased effort, dullness to percussion, or overt respiratory distress.   CARDIOVASCULAR: S1 and S2 appreciated. Systolic murmur was heard in mitral auscultation site radiating to her axilla, 3/6. Chest is nontender to palpation. 1+ pedal pulses. Trace to 1+ lower extremity edema. No calf tenderness or cyanosis was noted.   ABDOMEN: Soft, tender in the suprapubic area. No masses or hepatosplenomegaly was noted. The patient was uncomfortable on palpation of the suprapubic area, somewhat painful. A small, round mass was noted in the suprapubic area as well.  MUSCULOSKELETAL: Able to move all extremities. No cyanosis, degenerative joint disease, or kyphosis. Gait was not tested. The patient did have ulcerations and scrape on the right flank area as well as significant bruise, right flank area, and erythema in that area. No nodularity or induration. Skin was warm and dry to palpation.   LYMPH: No adenopathy in the cervical region.   NEUROLOGICAL: Cranial nerves grossly intact. Sensory is intact. No dysarthria or aphasia.   PSYCH: The patient  is alert and oriented to time, person, and place, cooperative. Memory is good. No significant confusion, agitation, or depression noted.  LABS/RADIOLOGIC STUDIES: BMP showed glucose of 89, BUN and creatinine of 130 and 2.91, and sodium 133. Estimated GFR for non-African American would be 14. Lipase level is low at 68. Liver enzymes revealed albumin  level of 3.2 and alkaline phosphatase 231, otherwise unremarkable. The patient's CK total was 555, MB fraction 2.0, and troponin was less than 0.02. White blood cell count was normal at 4.3, hemoglobin 9.4, platelet count 156, absolute neutrophil count is not checked, and MCV is high at 101.   Urinalysis is pending.  Radiologic studies are pending.   EKG showed electronic ventricular pacemaker at rate of 82 beats per minute, nonspecific ST-T changes were noted.   ASSESSMENT AND PLAN:  1. Dysphagia. Admit the patient to the medical floor, get gastroenterologist involved, and continue the patient on IV fluids as well as full liquid diet.  2. Dehydration. We will continue the patient on IV fluids watching her for fluid overload. 3. Acute renal failure, likely related to dehydration as well as urinary retention. We will place Foley catheter and we will continue IV fluids. We will check urinalysis to rule out urinary tract infection and treat urinary tract infection if one exists.  4. Urinary retention. We will place a Foley catheter and we will initiate Detrol because of bladder instability the patient describes.  5. Anemia. The same as it was in October 2013. We will follow with rehydration.  6. Hyponatremia, likely dehydration related. Continue to check with IV fluid administration.  TIME SPENT: One hour. ____________________________ Theodoro Grist, MD rv:slb D: 01/23/2012 10:16:19 ET T: 01/23/2012 10:57:55 ET JOB#: [id number]  cc: Theodoro Grist, MD, <Dictator> Vianne Bulls. Arline Asp, MD Theodoro Grist MD ELECTRONICALLY SIGNED 02/02/2012 16:15

## 2014-06-08 NOTE — Consult Note (Signed)
Pt swallowing better, she is hallucinating, saw family moving things out of her house through the hospital window. May need low dose haldol or psy evaluation. Nurses report good bowel movements yesterday.  VSS afebrile, she refused to go for a few weeks to a rest home.  Since she is swallowing better and getting calories in  I will sign off. Avoid cold beverages and stay with present diet forever.  Electronic Signatures: Manya Silvas (MD)  (Signed on 09-Dec-13 13:08)  Authored  Last Updated: 09-Dec-13 13:08 by Manya Silvas (MD)

## 2014-06-08 NOTE — Consult Note (Signed)
Brief Consult Note: Diagnosis: Dysphagia, dehydration, renal failure.   Patient was seen by consultant.   Consult note dictated.   Comments: Appreciate consult for 81 y/o caucasian woman with complex medical history admitted with dysphagia, dehydration, and renal failure, for evaluation of dysphagia. Patient reports intermittent dysphagia on and off for quite some time. Has had esophageal strictures, requiring dilation in the past. States that her dysphagia has gotten progressively worse over the last few weeks, and now almost anything- whether solid or liquid, gets hung in her lower chest and she finds this uncomfortable. States sometimes the food bolus regurgitates back into her mouth.  Does also report some suprapubic discomfort that was has been improved after foley catheter insertion. Worried about constipation as she has not had a bowel movement in 2d.  Denies further GI complaints. Has been taken off coumadin last month due to 2 severe nosebleeds, and now only taking ASA 81mg  daily. No other NSAIDs. Follows with Dr Edwin Dada at [facility] for her cardiac disease.  Impression and Plan. Dysphagia. Will likely require EGD when clinically feasible, may need dilation. Will discuss further with Dr Gustavo Lah and need to be off coumadin- this should be clarified with the primary admitting team, especially as patient states this was stopped due to nosebleeds.  Would consider renal consult due to elevated BUN and Creatinine. Will assess CBC, pt/inr in am..  Electronic Signatures: Stephens November H (NP)  (Signed 04-Dec-13 18:07)  Authored: Brief Consult Note   Last Updated: 04-Dec-13 18:07 by Theodore Demark (NP)

## 2014-06-08 NOTE — Consult Note (Signed)
Brief Consult Note: Diagnosis: Cognitive disorder NOS.   Patient was seen by consultant.   Consult note dictated.   Recommend further assessment or treatment.   Orders entered.   Comments: Anne [name] appears to have significant dementia. I was unable to perform MMPI today but in Apr it was 11. The patient wants to return to home but is unable to apprecaiate any difficulties or problems even though she lives alone.   PLAN: 1. The patient does not have the capacity to make decisions about her disposition.  2. Apparently she would qualify for hospice care. Conflict in the family. Son wants placement. Try to phone her daughter but was unable to reach her. Will folow along with Palliative care.  Electronic Signatures: Orson Slick (MD)  (Signed 09-Dec-13 18:14)  Authored: Brief Consult Note   Last Updated: 09-Dec-13 18:14 by Orson Slick (MD)

## 2014-06-08 NOTE — Consult Note (Signed)
Chief Complaint:   Subjective/Chief Complaint Please see full GI consult.  Patient presenting with dehydration and dysphagia.  Positive previous hx of distal esophageal ring with multiple dilations.  History of ckd, multiple other medical issues including AF recently taken off coumadin due to recurring epistaxis.  Will arrange for egd tomorrow pm with possible dilation.  Will recheck plt, pt and lytes in am.   VITAL SIGNS/ANCILLARY NOTES: **Vital Signs.:   04-Dec-13 14:43   Temperature Temperature (F) 97.8   Celsius 36.5   Temperature Source oral   Pulse Pulse 82   Respirations Respirations 20   Systolic BP Systolic BP 833   Diastolic BP (mmHg) Diastolic BP (mmHg) 54   Mean BP 70   Pulse Ox % Pulse Ox % 97   Pulse Ox Activity Level  At rest   Oxygen Delivery Room Air/ 21 %   Brief Assessment:   Cardiac Irregular    Respiratory clear BS    Gastrointestinal details normal Soft  Nondistended  No masses palpable  Bowel sounds normal  No rebound tenderness  mild tenderness to palpation in the suprapubic region.   Lab Results: Routine Chem:  04-Dec-13 16:57    Glucose, Serum  101   BUN  111   Creatinine (comp)  2.64   Sodium, Serum 139   Potassium, Serum  3.4   Chloride, Serum 103   CO2, Serum 25   Calcium (Total), Serum 8.5   Anion Gap 11   Osmolality (calc) 313   eGFR (African American)  19   eGFR (Non-African American)  16 (eGFR values <74mL/min/1.73 m2 may be an indication of chronic kidney disease (CKD). Calculated eGFR is useful in patients with stable renal function. The eGFR calculation will not be reliable in acutely ill patients when serum creatinine is changing rapidly. It is not useful in  patients on dialysis. The eGFR calculation may not be applicable to patients at the low and high extremes of body sizes, pregnant women, and vegetarians.)  Routine Coag:  04-Dec-13 12:47    INR 1.1 (INR reference interval applies to patients on anticoagulant therapy. A  single INR therapeutic range for coumarins is not optimal for all indications; however, the suggested range for most indications is 2.0 - 3.0. Exceptions to the INR Reference Range may include: Prosthetic heart valves, acute myocardial infarction, prevention of myocardial infarction, and combinations of aspirin and anticoagulant. The need for a higher or lower target INR must be assessed individually. Reference: The Pharmacology and Management of the Vitamin K  antagonists: the seventh ACCP Conference on Antithrombotic and Thrombolytic Therapy. VOUZH.4604 Sept:126 (3suppl): [id number]. A HCT value >55% may artifactually increase the PT.  In one study,  the increase was an average of 25%. Reference:  "Effect on Routine and Special Coagulation Testing Values of Citrate Anticoagulant Adjustment in Patients with High HCT Values." American Journal of Clinical Pathology 2006;126:400-405.)  Routine Hem:  04-Dec-13 07:48    Platelet Count (CBC) 156 (Result(s) reported on 23 Jan 2012 at 08:06AM.)   Electronic Signatures: Loistine Simas (MD)  (Signed 04-Dec-13 19:43)  Authored: Chief Complaint, VITAL SIGNS/ANCILLARY NOTES, Brief Assessment, Lab Results   Last Updated: 04-Dec-13 19:43 by Loistine Simas (MD)

## 2014-06-08 NOTE — Consult Note (Signed)
Chief Complaint:   Subjective/Chief Complaint no nausea or emesis.  mild epigastric and suprapubic pain/discomfort.  had [place] earlier re renal.   VITAL SIGNS/ANCILLARY NOTES: **Vital Signs.:   05-Dec-13 06:21   Vital Signs Type Routine   Temperature Temperature (F) 98.2   Celsius 36.7   Temperature Source oral   Pulse Pulse 80   Respirations Respirations 18   Systolic BP Systolic BP 93   Diastolic BP (mmHg) Diastolic BP (mmHg) 52   Mean BP 65   Pulse Ox % Pulse Ox % 96   Pulse Ox Activity Level  At rest   Oxygen Delivery Room Air/ 21 %  *Intake and Output.:   Shift 05-Dec-13 07:00   Length of Stay Totals Intake:  1730 Output:  2200    Net:  -470    Daily 07:00   Length of Stay Totals Intake:  1730 Output:  2200    Net:  -470    Shift 15:00   Length of Stay Totals Intake:  1730 Output:  2900    Net:  -1170   Brief Assessment:   Cardiac Irregular    Respiratory clear BS    Gastrointestinal details normal Soft  Nondistended  No masses palpable  Bowel sounds normal  No rebound tenderness  mild discomfort to palpation in the epigastrum and suprapubic area.   Lab Results:  Routine Chem:  05-Dec-13 06:41    Glucose, Serum 90   BUN  103   Creatinine (comp)  2.39   Sodium, Serum 140   Potassium, Serum 3.6   Chloride, Serum 107   CO2, Serum 24   Calcium (Total), Serum 8.7   Anion Gap 9   Osmolality (calc) 311   eGFR (African American)  21   eGFR (Non-African American)  18 (eGFR values <16mL/min/1.73 m2 may be an indication of chronic kidney disease (CKD). Calculated eGFR is useful in patients with stable renal function. The eGFR calculation will not be reliable in acutely ill patients when serum creatinine is changing rapidly. It is not useful in  patients on dialysis. The eGFR calculation may not be applicable to patients at the low and high extremes of body sizes, pregnant women, and vegetarians.)  Routine Coag:  05-Dec-13 06:41    Prothrombin 14.5   INR 1.1  (INR reference interval applies to patients on anticoagulant therapy. A single INR therapeutic range for coumarins is not optimal for all indications; however, the suggested range for most indications is 2.0 - 3.0. Exceptions to the INR Reference Range may include: Prosthetic heart valves, acute myocardial infarction, prevention of myocardial infarction, and combinations of aspirin and anticoagulant. The need for a higher or lower target INR must be assessed individually. Reference: The Pharmacology and Management of the Vitamin K  antagonists: the seventh ACCP Conference on Antithrombotic and Thrombolytic Therapy. Chest.2004 Sept:126 (3suppl): [id number]. A HCT value >55% may artifactually increase the PT.  In one study,  the increase was an average of 25%. Reference:  "Effect on Routine and Special Coagulation Testing Values of Citrate Anticoagulant Adjustment in Patients with High HCT Values." American Journal of Clinical Pathology 2006;126:400-405.)  Routine Hem:  05-Dec-13 06:41    WBC (CBC) 3.6   RBC (CBC)  2.63   Hemoglobin (CBC)  8.8   Hematocrit (CBC)  26.7   Platelet Count (CBC)  126   MCV  102   MCH 33.4   MCHC 32.8   RDW 13.3   Neutrophil % 54.4   Lymphocyte % 23.6  Monocyte % 18.8   Eosinophil % 2.7   Basophil % 0.5   Neutrophil # 1.9   Lymphocyte #  0.8   Monocyte # 0.7   Eosinophil # 0.1   Basophil # 0.0 (Result(s) reported on 24 Jan 2012 at 07:06AM.)   Assessment/Plan:  Assessment/Plan:   Assessment 1) dysphagia, patient with history of multiple esophageal dilations in the past.  Patietn with similar sx.    Plan 1) egd with possible esophageal dilatin today.  Discussed with Dr Vira Agar who has done her multiple dilations in the past, he will do the case. I have discussed the risks benefits and complicatins of egd to include not limited to beeding infection perforation and sedation and she wishes to proceed.   Electronic Signatures: Loistine Simas (MD)   (Signed 05-Dec-13 14:58)  Authored: Chief Complaint, VITAL SIGNS/ANCILLARY NOTES, Brief Assessment, Lab Results, Assessment/Plan   Last Updated: 05-Dec-13 14:58 by Loistine Simas (MD)

## 2014-06-08 NOTE — Consult Note (Signed)
Patien should stay on purred, full liquid diet forever.  Electronic Signatures: Manya Silvas (MD)  (Signed on 06-Dec-13 18:34)  Authored  Last Updated: 06-Dec-13 18:34 by Manya Silvas (MD)

## 2014-06-08 NOTE — Consult Note (Signed)
PATIENT NAME:  Anne [name], Anne [name] MR#:  [id number] DATE OF BIRTH:  11/11/1927  DATE OF CONSULTATION:  01/28/2012  REFERRING PHYSICIAN:  Theodoro Grist, MD  CONSULTING PHYSICIAN:  Jusitn Salsgiver B. Laneshia Pina, MD  REASON FOR CONSULTATION: To assess the capacity to make decisions about disposition.   IDENTIFYING DATA: Anne [name] is an 81 year old female with history of multiple medical problems who has been repeatedly hospitalized at [facility] since July of 2013. She now is admitted for dysphagia and required esophageal stricture dilation. The patient lives alone at [facility]. Her son has healthcare power-of-attorney. She has one daughter who lives locally and reportedly does all the errands and shopping. The patient insists on being discharged back to home to live alone. Her son has reservations about it. I was unable to get in touch with her daughter. This is my first contact with Anne [name] but she was assessed by Dr. Weber Cooks in April of 2013 at which time he found severe cognitive decline with a Mini-Mental status of 11. I was unable to assess her Mini-Mental status and she appears very tired and frail, has difficulty speaking, complains of a headache, and is not at all interested in working with psychiatrist. She insists that she return home. She states that twice she was placed in a skilled nursing facility and did not like it. She makes a comment that when she feels like today weak and in pain she wants to be left alone and does not want anybody to bother her. Per care manager report, the son in addition to multiple medical problems and cognitive decline reports frequent falls and is in favor of placing the patient in a supervised setting. The patient denies any symptoms of depression, anxiety, or psychosis. She denies alcohol or illicit substance use.   PAST PSYCHIATRIC HISTORY: None reported. There were no hospitalizations or suicide attempts. Per Dr. Weber Cooks, there is a history  of anxiety and at some point she was treated with Wellbutrin 150 mg a day.   FAMILY PSYCHIATRIC HISTORY: None reported.   PAST MEDICAL HISTORY: Multiple medical problems.  1. Coronary artery disease, status post CABG. 2. Congestive heart failure with ejection fraction of 35%.  3. Atrial fibrillation, on Coumadin. 4. Chronic kidney disease.  5. Hypertension.  6. Dyslipidemia.  7. Status post .. 8. Non-Hodgkin's lymphoma.  9. Iron deficiency anemia, on IV iron.  10. Esophageal stricture.  11. Hypothyroidism.  12. Gastroesophageal reflux disease.  13. B12 deficiency.   MEDICATIONS ON ADMISSION:  1. Advair Diskus 250/50 twice daily.  2. Aspirin 81 mg daily.  3. Wellbutrin 150 two pills at bedtime  4. Diclofenac 3% topical gel 3 times daily as needed.  5. Eplerenone 25 mg daily.  6. Fluticasone daily.  7. Isosorbide 60 mg daily.  8. Synthroid 137 mcg daily.  9. Librax 5/2.5 three times daily as needed.  10. Magnesium hydroxide 30 mg daily as needed. 11. Metolazone 2.5 daily. 12. Metoprolol 12.5 mg twice daily.  13. Nitrostat 0.4 mg every five minutes as needed. 14. Pantoprazole 40 mg daily.  15. Potassium chloride 20 mEq daily.  16. Pravastatin 40 mg at bedtime.  17. Senna 50/8.6 mg twice daily.  18. Spiriva 18 mcg daily.  19. Torsemide 40 mg twice daily.  20. Tylenol 325 mg every four hours as needed.  21. Vitamin B12 1000 mcg daily.  22. Warfarin 2.5 mg daily.   ALLERGIES: No known drug allergies.   SOCIAL HISTORY: As above, she lives alone. She  has three children who live locally. Son has healthcare power-of-attorney. I was unable to contact any of them tonight.   REVIEW OF SYSTEMS: Difficult to obtain. The patient complains of a headache. She did receive Tylenol from the nurse. She hopes that it will take care of it.   PHYSICAL EXAMINATION:   VITAL SIGNS: Blood pressure 129/68, pulse 82, respirations 18, temperature 97.7.   GENERAL: This is an elderly tired and  weak appearing female in no acute distress.   The rest of the physical examination is deferred to her primary attending.   LABORATORY DATA: Glucose 113, BUN 36, creatinine 1.2, sodium 137, potassium 4. LFTs within normal limits except alkaline phosphatase of 231. Cardiac enzymes negative. CBC white blood count 5.5, red blood cells 2.76, hemoglobin 9.4, hematocrit 28, platelets 133. Urinalysis is not suggestive of urinary tract infection.   EKG ventricular pacemaker.   MENTAL STATUS EXAMINATION: The patient is alert. She is oriented to person and place. She knows it is November 2013. She is not certain of the reason of her hospitalization. She is pleasant and polite. Her speech is very slow and slurred, difficult to understand. She denies suicidal or homicidal ideation. Her mood is anxious. Thought process is slow. She does not appear to psychotic. Her cognition is below average. Unable to perform Mini-Mental status exam. Her insight and judgment are extremely poor.   SUICIDE RISK ASSESSMENT: This is a patient with history of depression and anxiety but cognitive decline. She is unable to plan or execute a suicide attempt but requires support and supervision.   DIAGNOSIS:  AXIS I: Cognitive disorder, not otherwise specified.   AXIS II: Deferred.   AXIS III: Multiple medical problems as above.   AXIS IV: Physical decline, cognitive decline, primary support, poor insight into her problems.   AXIS V: GAF 35.    PLAN:  1. The patient does not have the capacity to make decisions about her disposition. She is simply unable to have any discussion about pros and cons and problems associated with independent living. She just wants to return home. Apparently the patient would qualify for Hospice care. Per care manager, there is a conflict in the family. I was unable to reach any of her children. I will follow along with Palliative Care.  2. The patient should not be getting Wellbutrin at night. This is  a marked stimulant oftentimes prescribed for mild ADHD symptoms and it will certainly affect sleep. Also, it is not appropriate for people with poor appetite. I will discuss it with the family.   ____________________________ Wardell Honour Bary Leriche, MD jbp:[id number] D: 01/28/2012 20:12:28 ET T: 01/29/2012 10:40:49 ET JOB#: [id number]  cc: Keshun Berrett B. Bary Leriche, MD, <Dictator> Clovis Fredrickson MD ELECTRONICALLY SIGNED 02/02/2012 2:17

## 2014-06-08 NOTE — Consult Note (Signed)
Chief Complaint:   Subjective/Chief Complaint seen for dysphagia.  tolerating pureed and warm liquids.   VITAL SIGNS/ANCILLARY NOTES: **Vital Signs.:   06-Dec-13 13:58   Vital Signs Type Routine   Temperature Temperature (F) 97.5   Celsius 36.3   Temperature Source Oral   Pulse Pulse 80   Respirations Respirations 20   Systolic BP Systolic BP 492   Diastolic BP (mmHg) Diastolic BP (mmHg) 64   Mean BP 86   Pulse Ox % Pulse Ox % 95   Pulse Ox Activity Level  At rest   Oxygen Delivery Room Air/ 21 %   Brief Assessment:   Cardiac Regular    Respiratory clear BS    Gastrointestinal details normal Soft  Nondistended  No masses palpable  Bowel sounds normal  mild tenderness in the suprapubic area.   Lab Results: Routine Chem:  06-Dec-13 14:48    Glucose, Serum  141   BUN  54   Creatinine (comp)  1.35   Sodium, Serum 142   Potassium, Serum 3.7   Chloride, Serum  111   CO2, Serum 24   Calcium (Total), Serum  8.3   Anion Gap 7   Osmolality (calc) 300   eGFR (African American)  42   eGFR (Non-African American)  36 (eGFR values <64mL/min/1.73 m2 may be an indication of chronic kidney disease (CKD). Calculated eGFR is useful in patients with stable renal function. The eGFR calculation will not be reliable in acutely ill patients when serum creatinine is changing rapidly. It is not useful in  patients on dialysis. The eGFR calculation may not be applicable to patients at the low and high extremes of body sizes, pregnant women, and vegetarians.)   Assessment/Plan:  Assessment/Plan:   Assessment 1) dysphagia-combined etiology of schatzki ring (dilated yesterday) and presbyesophagus with diffuse esophageal spasm.  Patietn will do much better continuing on the pureed diet, warm liquids, avoid cold beveridges.    Plan 1) as above,  Dr  Vira Agar to see over the weekend.   Electronic Signatures: Loistine Simas (MD)  (Signed 06-Dec-13 17:38)  Authored: Chief Complaint, VITAL  SIGNS/ANCILLARY NOTES, Brief Assessment, Lab Results, Assessment/Plan   Last Updated: 06-Dec-13 17:38 by Loistine Simas (MD)

## 2014-06-08 NOTE — Consult Note (Signed)
PATIENT NAME:  Anne [name], Anne [name] MR#:  [id number] DATE OF BIRTH:  02-11-1928  DATE OF CONSULTATION:  01/23/2012  REFERRING PHYSICIAN:  Theodoro Grist, MD CONSULTING PHYSICIAN:  Theodore Demark, NP  HISTORY OF PRESENT ILLNESS: Ms. Lehr is a pleasant 81 year old Caucasian woman with a complex past medical history. Gastroenterology has been consulted at the request of Dr. Theodoro Grist for evaluation of dysphagia. Noted, the patient was admitted with dysphagia, dehydration, and renal failure. The patient reports intermittent dysphagia on and off for quite some time, has had esophageal strictures requiring dilation in the past, the last being 2009. States that her dysphagia has gotten progressively worse over the last few weeks and now almost anything, whether solid or liquid, gets hung in her lower chest and she finds this uncomfortable. States sometimes the food bolus regurgitates back into her mouth. Does also worry about some suprapubic discomfort that has been improved after Foley catheter insertion. She is worried about constipation as she has not had a bowel movement in two days. Denies further GI complaints. Does report that she has been taken off Coumadin last month due to severe nosebleeds and now is only taking ASA 81 mg p.o. daily. No other NSAIDs. Follows with Dr. Edwin Dada at [facility] for her cardiac disease.   PAST MEDICAL HISTORY:  1. Atrial fibrillation. 2. Congestive heart failure. 3. Stage IV non-Hodgkin's lymphoma, diagnosed 2003. 4. Iron deficiency anemia requiring IV iron supplementation. Follows with Dr. Oliva Bustard. 5. Hyponatremia. 6. Rhabdomyolysis. 7. Coronary artery disease with coronary artery bypass graft. 8. Pacemaker placement. 9. Congestive heart failure, LVEF 35%. 10. Hypertension. 11. Hyperlipidemia. 12. Depression. 13. Bilateral knee replacement. 14. Hypothyroidism. 15. Anxiety. 16. Gastroesophageal reflux disease.  17. Chronic kidney disease. 38. March 2013  streptococcal pneumonia.  MEDICATIONS: 1. Advair 250/50 twice daily.  2. ASA 81 mg p.o. daily.  3. Bupropion 150 mg/250 2 pills p.o. at bedtime.  4. Diclofenac topical gel three times daily p.r.n. 5. Eplerenone 25 mg p.o. daily.  6. Fluticasone one spray to each naris daily.  7. Isosorbide mononitrate 60 mg p.o. daily.  8. Levothyroxine 137 mcg p.o. daily.  9. Librax 5/2.5 mg 1 capsule three times daily p.r.n. 10. Magnesium 30 mg p.o. daily p.r.n.  11. Metolazone 2.5 mg p.o. daily.  12. Metoprolol tartrate 12.5 mg p.o. twice a day. 13. Nitrostat 0.4 sublingual p.r.n.  14. Pantoprazole 40 mg p.o. daily.  15. Potassium chloride 20 mEq p.o. daily.  16. Pravastatin 40 mg p.o. at bedtime.  17. Senna 50/8/6 mg p.o. twice a day. 18. Spiriva 18 mcg inhalation daily.  19. Torsemide 40 mg p.o. twice a day. 20. Tylenol 325 mg 2 tabs every 4 hours p.r.n.  21. Vitamin B12 100 p.o. daily.   ALLERGIES: Cozaar, Benadryl.   FAMILY HISTORY: Coronary artery disease in parents. No GI family history.   SOCIAL HISTORY: She resides at [facility], retired. No tobacco, illicits, or alcohol.  REVIEW OF SYSTEMS: Does report fluctuating weight, fatigue, feeling cold often, generalized arthralgias, shortness of breath with exertion, occasional cough, bilateral lower extremity edema, occasional hemorrhoid flares if she gets overly constipated, and has some issues with urinary incontinence at times as well as urinary retention for which she has had the Foley catheter placed. Otherwise, 10 systems were reviewed and were negative.   LABS/RADIOLOGIC STUDIES: Most recent labs: Glucose 89, BUN 130, creatinine 2.91, sodium 133, potassium 4.0, chloride 98, and GFR 14. Lipase 68. Total protein 7.5, albumin 3.2, total bilirubin 0.7, alkaline phosphatase  231, AST 20, and ALT 17. CPK-MB with troponin has been negative. WBC 4.3, hemoglobin 9.4, hematocrit 28.3, and platelet count 156. Mild macrocytosis. PT 14.9 and INR  1.1.   Chest x-ray did show enlargement of the cardiac chambers without overt evidence of congestive heart failure or pneumonia.   PHYSICAL EXAMINATION:   VITALS: Most recent vital signs: Temperature 97.4, pulse 80, respiratory rate 18, blood pressure 109/63, and oxygen saturation 96%.   GENERAL: Pleasant, Caucasian woman lying in bed in no acute distress, elderly.   HEENT: Normocephalic, atraumatic. No scleral icterus. No redness, drainage, or inflammation to the eyes or the mouth or the nares.   NECK: Supple. No thyromegaly, lymphadenopathy, or JVD.   PULMONARY: Respirations are eupneic. Lungs are clear to auscultation bilaterally but somewhat decreased at bilateral bases.   CARDIOVASCULAR: S1 and S2, systolic murmur appreciated best in the pulmonic area with grade 4/6 with mild thrill. Trace extremity edema, lower. No gallop. Rhythm regular.   ABDOMEN: Flat, nondistended. Bowel sounds x4. Minimal tenderness to the suprapubic area. No masses, hepatosplenomegaly, guarding, rigidity, or peritoneal signs.   GENITOURINARY: Foley patent, draining clear yellow urine.   RECTAL: Deferred.   MUSCULOSKELETAL: Some generalized weakness. Strength 4/5. MAEW x4. Sensation intact. No cyanosis.   SKIN: Warm, dry, and pink. No erythema, lesion, or rash.   NEUROLOGICAL: Cranial nerves II through XII grossly intact. Alert and oriented x3. Speech clear. No facial droop.   PSYCHIATRIC: Cooperative, good memory, pleasant.   IMPRESSION AND PLAN: Dysphagia: Will likely require EGD when clinically feasible. May need dilation. We will discuss further with Dr. Gustavo Lah and she needs to be definitely off the Coumadin. This should be clarified with the primary admitting team, especially as the patient states this was stopped due to nosebleeds. Would definitely consider renal consult due to elevated BUN/creatinine. We will assess CBC and PT-INR in the morning.   These services were provided by Stephens November, MSN, [facility] in collaboration with Dr. Loistine Simas with whom I have discussed this patient in full.   Thank you for this consultation.  ____________________________ Theodore Demark, NP chl:slb D: 01/24/2012 07:54:00 ET T: 01/24/2012 08:41:32 ET JOB#: [id number]  cc: Theodore Demark, NP, <Dictator> [place] SIGNED 01/30/2012 10:57

## 2014-06-08 NOTE — Consult Note (Signed)
Brief Consult Note: Comments: pt well known to me . known OA rt knee unable to have TKR due to medical conditions, worsening pain with ambulation. rt knee effusion, valgus. aspirated of 20cc noninflammatory fluid, injected with 2cc xylocaine,2cc marcaine 1cc kenalog.  Electronic Signatures: Leeanne Mannan., Eugene Gavia (MD)  (Signed 09-Dec-13 19:11)  Authored: Brief Consult Note   Last Updated: 09-Dec-13 19:11 by Leeanne Mannan., Eugene Gavia (MD)

## 2014-06-08 NOTE — Consult Note (Signed)
Pt had loose stools in response to laxatives, constipation and impaction no longer a problem.  VSS, chest clear abd soft and not tender.  Eating well, drinking well.  Probably home tomorrow from GI standpoint.  Electronic Signatures: Manya Silvas (MD)  (Signed on 08-Dec-13 10:02)  Authored  Last Updated: 08-Dec-13 10:02 by Manya Silvas (MD)

## 2014-06-08 NOTE — Consult Note (Signed)
CC: dysphagia.  She is swallowing diet well.  Iv hydration with BUN down to 36 but she had SOB last night with rales and responded to lasix.  Iv fluids stopped.  She has had several bowel movements since last night with the MOM and fleets enema working. PE VSS afebrile, a few faint crackles on lower left posterior ield.Hgb 9.4, pkt 133.  Probably home in a day or so.  Will continue MOM today.  Electronic Signatures: Manya Silvas (MD)  (Signed on 07-Dec-13 09:19)  Authored  Last Updated: 07-Dec-13 09:19 by Manya Silvas (MD)

## 2014-06-11 NOTE — H&P (Signed)
PATIENT NAME:  Anne [name], Anne [name] MR#:  [id number] DATE OF BIRTH:  07/08/1927  DATE OF ADMISSION:  02/24/2012  PRIMARY CARE PHYSICIAN:  Dr. Apolonio Schneiders.   REFERRING PHYSICIAN:  Dr. Michel Santee.  CHIEF COMPLAINT:  Cough, dizziness and fall.   HISTORY OF PRESENT ILLNESS:  The patient is an 81 year old Caucasian female with multiple medical problems, including coronary artery disease status post CABG, stage IV non-Hodgkin lymphoma diagnosed in year 2003, status post permanent pacemaker placement, systolic congestive heart failure with an ejection fraction of 25%, hypothyroidism and multiple other medical problems, was recently admitted to the hospital in December 2013 and was sent over to rehabilitation center.  After 21 days of rehab patient was discharged home yesterday.  The patient was reporting that she was feeling extremely dizzy at the time of discharge and she sustained a fall today.  She denies any headache or blurry vision.  Denies any speech difficulty or difficulty in swallowing.  She was just feeling dizzy and flipped over and fell today.  Following the fall, she was having left wrist pain regarding which she was brought into the ER.  The patient is also having cough for the past few days.  She was started on levofloxacin starting from December 31st for urinary tract infection.  In the ER x-ray has revealed a left distal radial fracture and the ER physician, Dr. Michel Santee did closed reduction and splint placement.  EKG has revealed paced rhythm.  CT of the head is done which has revealed no acute findings.  Hospitalist team is called to admit the patient regarding dizziness, acute bronchitis and left radial fracture.  UA is also positive for urinary tract infection.  The patient has received first dose of IV levofloxacin in the ER.  During my examination patient is reporting that her pain is well-controlled.  She is still feeling dizzy.  Denies any vertigo in the past.  Denies chest pain or shortness of  breath.   PAST MEDICAL HISTORY: 1.  History of atrial fibrillation.  She was on Coumadin, but that was discontinued during the previous admission in December and patient was placed on aspirin 81 mg.  2.  History of stage IV non-Hodgkin lymphoma.  3.  History of iron deficiency anemia requiring IV iron in the past.  4.  Coronary artery disease, status post coronary artery bypass grafting.  5.  Permanent pacemaker placement. 6.  Congestive heart failure with an ejection fraction of 35%. 7.  Hypertension. 8.  Hyperlipidemia. 9.  Depression.  10.  Hypothyroidism.  11.  Anxiety. 12.  GERD. 13.  History of hyponatremia.  14.  Chronic kidney disease. 15.  Cardiomyopathy.   PAST SURGICAL HISTORY:  Bilateral knee replacement, pacemaker placement.   ALLERGIES:  ACE INHIBITORS, BENADRYL, CODEINE, COZAAR, DARVOCET-N.   HOME MEDICATIONS:  Vitamin B12 100 mcg 1 tablet once daily, Tylenol 325 mg 2 tablets by mouth every 4 hours as needed, torsemide 20 mg 1 tablet once daily, Spiriva 18 mcg 1 capsule inhalation once daily, Senna Plus 50 mg 1 tablet by mouth twice daily, pravastatin 40 mg by mouth daily, potassium chloride 10 mEq 1 capsule by mouth once daily, pantoprazole 40 mg once daily, Nitrostat 0.4 mg sublingually every 5 minutes as needed for chest pain, metoprolol tartrate 25 mg half tablet twice daily, magnesium oxide 30 mL by mouth once daily as needed basis, Librax 1 capsule by mouth 3 times daily, Levothyroxine 137 mcg once daily, isosorbide mononitrate 1 tablet by mouth once daily, nystatin  15 mL by mouth every 8 hours as needed, fluticasone  1 spray inhalation each nostril once daily, bupropion 2 tablets once daily, aspirin 81 mg once daily, Advair 250 mcg 1 puff inhalation twice daily.   FAMILY HISTORY:  Coronary artery disease in both parents.   SOCIAL HISTORY:  Just discharged from the rehab center and lives at home.  Denies smoking, alcohol or illicit drug usage.   REVIEW OF SYSTEMS:    CONSTITUTIONAL:  Denies any fever.  Complaining of weakness and dizziness.  There is left hand pain from the fracture.  Denies any weight loss or weight gain.  EYES:  Denies any blurry vision, pain, redness, inflammation, glaucoma.  EARS, NOSE, THROAT:  Denies tinnitus, epistaxis, discharge, snoring, postnasal drip, sinus pain.  RESPIRATORY:  Denies cough, wheezing, hemoptysis, asthma, painful respiration.  CARDIOVASCULAR:  Denies chest pain, orthopnea, palpitations.  Complaining of dizziness, but denies any syncope, varicose veins.  GASTROINTESTINAL:  Denies nausea, vomiting, diarrhea, abdominal pain, jaundice, rectal bleeding, constipation.  GENITOURINARY:  Denies dysuria, hematuria, renal calculi.  GYNECOLOGIC AND BREAST:  Denies breast mass, vaginal discharge.  ENDOCRINE:  Denies polyuria, polyphagia, polydipsia, nocturia.  The patient has a history of hypothyroidism.  INTEGUMENTARY:  Denies acne, rash, lesions.  MUSCULOSKELETAL:  Complaining of pain in the left hand from the fracture.  Denies any knee pain, hip pain, shoulder pain, back pain.  NEUROLOGIC:  Denies any vertigo.  Complaining of dizziness.  Denies any dysarthria, weakness, epilepsy, dementia, headache.  PSYCHIATRIC:  Normal mood and affect.   PHYSICAL EXAMINATION: VITAL SIGNS:  Temperature 98.5, pulse 81, respirations 22, blood pressure 116/53.  GENERAL APPEARANCE:  Not in acute distress, thin built.  HEENT:  Normocephalic.  Pupils are equally reactive to light and accommodation.  No scleral icterus.  No conjunctival injection.  No nasal discharge.  No postnasal drip.  No pharyngeal edema.  Tympanic membranes are intact.  NECK:  Supple.  No JVD.  No carotid bruits.  No thyromegaly.  LUNGS:  Coarse bronchial breath sounds.  Moderate air entry.  No rhonchi.  No crackles.   CARDIOVASCULAR:  S1, S2 normal.  Regular rate and rhythm.  No anterior chest wall tenderness.  GASTROINTESTINAL:  Soft.  Bowel sounds are positive in all 4  quadrants.  Nontender, nondistended.  No masses felt.  NEUROLOGIC:  Awake, alert and oriented x 3.  Motor is grossly intact except the left upper extremity with pain from the fracture.  MUSCULOSKELETAL:  Left forearm in the distal radius area is intact in the splint.  Other joints, no effusion, swelling, tenderness.  INTEGUMENTARY:  No acne, rashes, lesions.  Normal turgor.  PSYCHIATRIC:  Normal mood and affect.   LABORATORY DATA:  Glucose 96, BUN 26, creatinine 1.24, sodium 133, potassium 3.3, chloride 97, CO2 26, GFR 40, anion gap 10, serum osmolality 271, total protein 6.4, albumin 2.8, total bilirubin 0.9, alkaline phosphatase 250.  WBC 8.2, hemoglobin 9.0, hematocrit 26.1, platelet count is 183,000.  Urine color yellow, clear in appearance, glucose negative, nitrite negative, leukocyte esterase 1+, bacteria none.  CT head, no acute findings.  No acute intracranial hemorrhage.  A CT scan was done in the first 12 hours after the event, showed sinusitis.  X-ray revealed left closed distal radial fracture.   ASSESSMENT AND PLAN: 1.  Near syncope, probably from acute bronchitis and acute cystitis.  Plan:  We will get orthostatics.  We will get neuro checks.  CT head is negative.  Carotid Dopplers and 2-D echocardiogram is  ordered.  Holding isosorbide mononitrate as it can cause severe dizziness and hypotension.  2.  Acute bronchitis.  We will give her IV levofloxacin.  The patient was on by mouth levofloxacin starting from December 31.  3.  Acute cystitis.  We will continue IV levofloxacin.  We will get urine culture and sensitivity.  4.  Closed left distal radius fracture, status post closed reduction and splint fixation by the ER physician.  Pain management with morphine.  Ortho consult is placed to Dr. Sabra Heck and occupational therapy consult is placed as well.  5.  Coronary artery disease status post coronary artery bypass graft.  The patient denies any chest pain.  We will monitor closely.  Continue  home medication except isosorbide mononitrate.  6.  Chronic atrial fibrillation, rate-controlled, status post a permanent pacemaker.  The patient's Coumadin was discontinued during previous admission.  We will continue baby aspirin.  7.  Chronic history of cardiomyopathy and congestive heart failure.  The patient is not fluid-overloaded.  Ejection fraction is 35% according to old medical records.  We will watch for symptoms and signs of fluid overload.  8.  CODE STATUS:  FULL CODE.  9.  Case management consult is placed regarding discharge planning.    The diagnosis and plan of care was discussed with the patient.  The patient is in agreement with  plan.   TOTAL TIME SPENT ON ADMISSION:  Fifty-five minutes.     ____________________________ Nicholes Mango, MD ag:ea D: 02/24/2012 00:40:52 ET T: 02/24/2012 03:21:38 ET JOB#: [id number]  cc: Nicholes Mango, MD, <Dictator> Vianne Bulls. Arline Asp, MD  Nicholes Mango MD ELECTRONICALLY SIGNED 03/06/2012 7:06

## 2014-06-11 NOTE — Consult Note (Signed)
Brief Consult Note: Diagnosis: Left Colles fracture.   Patient was seen by consultant.   Recommend further assessment or treatment.   Orders entered.   Comments: 81 year old female fell last night injuring the left wrist.  Seen in Emergency Room where Dr Charmayne Sheer reduced the Colles fracture and splinted this.  Admitted for breathing difficulties.    Exam:  Alert and oriented.  Left arm elevated on pillow.  circulation/sensation/motor function good with moderate swelling.  Skin intact.  Tender to palpation.   X-rays:  Angulated fracture left distal radius later reduced and splinted.  Position acceptable.    Rx;  Ice, elevate.  Will place in short arm cast tomorrow.  Electronic Signatures: Park Breed (MD)  (Signed 05-Jan-14 10:08)  Authored: Brief Consult Note   Last Updated: 05-Jan-14 10:08 by Park Breed (MD)

## 2014-06-11 NOTE — Discharge Summary (Signed)
PATIENT NAME:  Anne [name], Anne [name] MR#:  [id number] DATE OF BIRTH:  1928-01-26  DATE OF ADMISSION:  02/24/2012 DATE OF DISCHARGE:  02/27/2012  CONSULTATIONS: Orthopedic Surgery, Dr. Sabra Heck, and Palliative Care.  HOSPITAL COURSE: The patient is an 81 year old Caucasian female with a history of CAD, CHF and multiple other medical problems, presented to the ED with cough, dizziness and a fall. For detailed history and physical examination, please refer to the admission note dictated by Dr.  Margaretmary Eddy.  In the ED, the patient's x-ray showed left distal radius fracture, and ED physician, Dr. Michel Santee, did a closed reduction and a splint placement. The patient's CAT scan of head showed no acute findings. EKG revealed paced rhythm. The patient was admitted for near syncope possibly due to acute bronchitis and acute cystitis. The patient's carotid duplex showed no stenosis. Echocardiograph showed ejection fraction more than 55%. After admission, the patient has been treated with Levaquin, cough medication and nebulizer. The patient's symptoms have much improved. After palliative care consult, the patient is DNR status. The patient is clinically stable and will be discharged [facility] with hospice service today. I discussed the patient's discharge plan with the patient, case manager.   TIME SPENT: About 36 minutes.   DISCHARGE DIAGNOSES: 1. Chronic obstructive pulmonary disease exacerbation.  2. Status post fall and left distal radial fracture.  3. Hypertension.  4. Coronary artery disease.  5. Congestive heart failure with ejection fraction more than 55%, stable.   CODE STATUS: DNR.   HOME MEDICATIONS: Please refer to the [facility] physician discharge instructions and medication reconciliation list. The patient needs a prednisone taper and Levaquin for 5 days.   DIET: Low sodium diet.   ACTIVITY: As tolerated.  FOLLOW-UP CARE: Follow up with PCP within 1 to 2 weeks. Follow up with Dr. Sabra Heck, orthopedic  surgeon, within 1 to 2 weeks.  ____________________________ Demetrios Loll, MD qc:cb D: 02/27/2012 09:45:01 ET T: 02/27/2012 09:59:30 ET JOB#: [id number]  cc: Demetrios Loll, MD, <Dictator> Demetrios Loll MD ELECTRONICALLY SIGNED 02/27/2012 17:06

## 2014-06-11 NOTE — Consult Note (Signed)
    Comments   I spoke with pt's son, Kember Boch [phone]) who is pt's HCPOA. Son voiced concerns about pt's multiple hospitalizations as well as her rehab facilities admissions. He agrees that pt will not be able to return home, esp in light of pt's fall requiring readmission 1 day after returning home. Son confirms that pt is a DNR; out-of-facility form completed and placed in chart, order updated.  Son requested that pt have a pureed diet as per GI (last admission after dilation of esophageal stricture). Order entered.  goal for pt is for her to return to [facility] to complete rehab and transition to a SNF bed.    Electronic Signatures: Natalia Leatherwood Z (NP)  (Signed 06-Jan-14 17:09)  Authored: Palliative Care Phifer, Izora Gala (MD)  (Signed 06-Jan-14 20:30)  Authored: Palliative Care   Last Updated: 06-Jan-14 20:30 by Phifer, Izora Gala (MD)

## 2014-06-13 NOTE — Consult Note (Signed)
PATIENT NAME:  Anne [name], Anne [name] MR#:  [id number] DATE OF BIRTH:  25-Nov-1927  DATE OF CONSULTATION:  05/31/2011  REFERRING PHYSICIAN:   CONSULTING PHYSICIAN:  Gonzella Lex, MD  IDENTIFYING INFORMATION AND REASON FOR CONSULT: The patient is an 81 year old woman who was admitted to the hospital because of acute mental status changes a couple of days ago. Consult is for assistance in assessing mental status changes.   CHIEF COMPLAINT: "My stomach hurts".  HISTORY OF PRESENT ILLNESS: Information was obtained from interview with the patient and review of the chart. The patient was brought into the hospital on April 9th by her son who found her to be acutely with a change in mental status. It appears that he reported possibly that she had been looking a little sicker, worse for a day or two prior, but this was a rather acute change in mental state. He reported that she normally is pretty sharp and did not think she had any dementia. On evaluation here she was described as being confused and agitated. Since being here for the last couple of days, she has continued to have signs of mental status changes. Work-up has been pursued and no specific etiology has been discovered. When I met with the patient, her chief complaint was of her stomach hurting. She said that that was the reason she was in the hospital. She could not give me a clear description of how she came to be in the hospital. She was able to tell me where she lives and which of her children live nearby and give me several details that sound accurate about her normal day-to-day life. She denied feeling weak although from watching her she looks like she is probably significantly weaker than she must be at baseline. She did not report any other pain except in her stomach. She claims that she is able to eat or drink, although she hasn't had anything to drink recently. She is not aware of any kind of mental status change and was not aware that that was a  concern about her being in the hospital. I asked her if she was depressed and asked about several other symptoms of depression. She said that she did think that she was depressed but immediately said that it was because she was in the hospital. When I asked if she would still be depressed if she were at home, she said she thought she probably wouldn't. She also said that she had been feeling bad ever since her husband died which was several years ago. She said she still enjoyed visiting with her grandchildren and great-grandchildren and children and did not feel hopeless.   PAST PSYCHIATRIC HISTORY: Looking through some old notes I see a mention that she has a past history of what is called "anxiety" but I am not seeing many specifics about it. She appears to be on Wellbutrin 150 mg a day. It is not clear if this is the immediate or sustained or extended-release form. It is not clear exactly what the indication for that would be as there is no description of treatment for depression. The patient denies ever having been in a psychiatric hospital or ever having seen a mental health provider in the past and there is no documentation that she has.   PAST MEDICAL HISTORY: Extensive history of significant medical problems including coronary artery disease, has had two CABGs in the past. Has had a stroke in the past. Has a pacemaker. Has atrial fibrillation and has not  been able to tolerate Coumadin therapy. Was recently in the hospital last month for what appears to mainly have been a pneumonia. Was discharged from the hospital on March 20th.    SOCIAL HISTORY: Evidently she does live alone. She has two adult children and their families live close and they seem to keep a pretty close eye on her. Her husband died several years ago. The patient tells me she has not been able to drive for several years and does not particularly mind it. Other family members do her shopping for her. She enjoys spending time with her  family including great-grandchildren and grandchildren. She denies any animosity or difficulty with any of her extended family.   REVIEW OF SYSTEMS: Primarily she is complaining of her stomach hurting. She denies that she is constipated and denies that she has any pain with bowel movements or with passing urine. Denies feeling nauseated. She tells me that she has been able to eat and drink but from watching her she is clearly not very robust at either one of them right now. She denies being in any pain anywhere else and denies shortness of breath or any respiratory symptoms. As mentioned above, she did endorse feeling "depressed" but clarified that it was because she was in the hospital. She denied anhedonia. I asked her about the hallucinations that were reported on admission and she did not remember anything about it. She denied having seen or heard anything that seemed out of the ordinary or unusual during her time in the hospital.   MENTAL STATUS EXAMINATION: Elderly woman in a hospital room who was awake when I came into the room. She was cooperative but passive and appeared tired for much of our interview. She did, however, do her best to carry on with it. Eye contact was minimal. Psychomotor activity was slow. She has a bit of a tremor. Did not move very much. Speech was quiet and decreased in total amount. A little bit slurred probably due to her mouth being so dry at least in part. Affect was a little bit anxious and blunted. Mood was stated as being okay. Thoughts were showing no sign of delusional thinking or bizarre ideation. She denied any hallucinations. Denied suicidal or homicidal ideation. I performed a Mini-Mental Status exam and she scored a total of 11 which indicates fairly significant dementia. Unable to really make any decision clearly about judgment and insight right now.   PHYSICAL EXAMINATION: Full physical exam is not indicated by me but I did note that her strength was symmetric in  her upper and lower extremities. Her face appears symmetric. I do not see any sign of any facial droop. All of her cranial nerves seem to be basically intact. She did not seem to be in any terrible distress. She was able to sit up with assistance from the nurse and swallow some applesauce but was not able to contribute much to feeding herself.   ASSESSMENT: This is an 81 year old woman admitted to the hospital with acute mental status changes, multiple chronic and more subacute medical problems. Allegedly she was having hallucinations prior to admission. Her son is stating that her current mental status exam is still a significant decline from normal. On my evaluation today she does not appear to be delirious but does appear to have a significant amount of dementia. If this is indeed a significant change from her baseline, the most common cause of a rapid large change in dementia would be a stroke. Medications could  cause some degree of dementia and confusion as well as could other acute medical problems. She is not currently agitated or uncooperative or hostile.   TREATMENT PLAN: Unfortunately, I do not have any suggestions for any work-up that hasn't already been discussed by the primary treatment team. I understand that an MRI is probably not practical because she has a pacemaker. I think getting a repeat CAT scan at some point is probably reasonable to see if any sign can be seen of an evolving infarct. Otherwise, I think supportive measures and continued work-up as done by the treatment team are appropriate. If she does become delirious, especially at nighttime or agitated, I would suggest using low doses of intravenous antipsychotics or possibly IM low dose Geodon rather than using any benzodiazepines. I'm not going to enter any orders, however, at this point. I will follow.   DIAGNOSES PRINCIPLE AND PRIMARY:  AXIS I: Dementia of unclear etiology.   SECONDARY DIAGNOSES:  AXIS I: Resolving delirium of  unclear etiology.   AXIS II: No diagnosis.   AXIS III: Multiple medical problems. 1. Recent pneumonia. 2. Coronary artery disease. 3. History of stroke. 4. History of congestive heart failure. 5. History of renal insufficiency. 6. History of atrial fibrillation.   AXIS IV: Moderate. Chronic stress from burden of illness.   AXIS V: Functioning at time of evaluation 20.  TOTAL TIME SPENT ON THE CONSULT: 65 minutes with greater than half spent in direct care and care coordination.   ____________________________ Gonzella Lex, MD jtc:drc D: 05/31/2011 17:11:30 ET T: 05/31/2011 17:35:47 ET JOB#: [id number]  cc: Gonzella Lex, MD, <Dictator> Gonzella Lex MD ELECTRONICALLY SIGNED 05/31/2011 19:11

## 2014-06-13 NOTE — Consult Note (Signed)
PATIENT NAME:  Anne [name], Anne [name] MR#:  [id number] DATE OF BIRTH:  08/09/1927  DATE OF CONSULTATION:  06/02/2011  REFERRING PHYSICIAN:  Dr. Karsten Fells CONSULTING PHYSICIAN:  Rudell Cobb. Loletta Specter, MD  HISTORY: Ms. Ahmed is an 81 year old right-handed white patient of Dr. Arline Asp with history of hypertension, hyperlipidemia, hypothyroidism, coronary artery disease, atrial fibrillation, congestive heart failure with ejection fraction of 20%, non-Hodgkin's lymphoma, atherosclerotic cerebrovascular disease as judged by history of left carotid endarterectomy, peptic ulcer disease, gastritis, duodenitis, collagenous colitis, anemia, asthma, restless legs, hypothyroidism, and osteoarthritis status post left total knee replacement surgery. She was admitted to [facility] on 03/08 through 05/08/2011 with pneumonia, streptococcal bacteremia, and renal failure. She is now admitted 05/29/2011 from rehab and is referred for evaluation of altered mental status and confusion. History comes from the patient, her hospital chart, and hospital records.   The patient was brought to the Emergency Room at 11:15 a.m. on 05/29/2011 by EMTs summoned to the nursing home by staff who checked the patient and found her to be confused and hallucinating after they had received a call from her son who had telephoned the patient's room and not gotten a response. On arrival to the Emergency Room, blood pressure was 146/76 with heart rate 96, respirations 22, and oxygen saturation 95%. Temperature was 96 degrees Fahrenheit. Admission laboratory studies were notable for sodium decreased to 124. Admitting physician elicited history that the patient had been treated with scopolamine patch the prior evening for nausea. The patch was removed.   In the hospital, the patient has had gradual correction of her sodium level and has had improvement in mental status, but reportedly still with some confusion. The patient's family had been noted to state that the patient  does not have a history of dementia and is "usually very mentally sharp". Family and nurses report the patient much more clear-headed on 06/02/2011, but her son reported by telephone the patient's somewhat persistent concern with regard to going back home and his impression that it was her impression that her family did not want her to do this.   PHYSICAL EXAMINATION: The patient is a well developed and well nourished elderly white woman who was found in no apparent distress, normocephalic without evidence of trauma and with supple neck. She was alert and fully oriented with clear speech and normal expression and the calm affect. She expressed concern with regard to family not wanting her to go back home,  without agitation or significant dysphoria. On Mini-Mental state exam, she scored 28 out of 30 points, losing one point for attempt at copying a cube figure and one point for recalling two of three words at three minutes. Cranial nerve examination was symmetric and normal including eye movements and full visual field to finger count for each eye. Motor examination of the extremities showed normal tone and bulk and good strength throughout. Extremity coordination was symmetric and normal. Her gait was not tested.   IMPRESSION: Recent problems with altered mental status, confusion, improved and suspected secondary to a combination of low sodium level and side effect of scopolamine patch. Her score on Mini-Mental state exam today is normal for her age. She is expected to have some mild element of organic dementia in the setting of history of atrial fibrillation. I suspect that she is back to her prior baseline, but stressed by recent health problems and needs a period of rehab.   RECOMMENDATIONS:  1. I agree with her present work-up and treatment in the hospital including imaging  and laboratory studies, appropriate correction of low sodium level, and removal of scopolamine patch.  2. I do not see indication  for lumbar puncture or for EEG.  3. She may benefit from low dose of Haldol with regard to confusion or somewhat paranoid  thoughts with regard to her family's intentions regarding her going back home.   I appreciate being asked to see this pleasant and interesting lady.     ____________________________ Rudell Cobb. Loletta Specter, MD prc:bjt D: 06/07/2011 14:10:00 ET T: 06/07/2011 16:49:35 ET JOB#: [id number]  cc: Rudell Cobb. Loletta Specter, MD, <Dictator> Linton Flemings MD ELECTRONICALLY SIGNED 06/13/2011 10:58

## 2014-06-13 NOTE — H&P (Signed)
PATIENT NAME:  Anne [name], Anne [name] MR#:  [id number] DATE OF BIRTH:  24-May-1927  DATE OF ADMISSION:  04/27/2011  REFERRING PHYSICIAN: Graciella Freer, MD   CHIEF COMPLAINT: Weakness.   HISTORY OF PRESENT ILLNESS: The patient is an 81 year old Caucasian female with history of atrial fibrillation, on Coumadin, coronary artery disease, status post CABG, chronic systolic congestive heart failure with ejection fraction of 35%, with chronic kidney disease who presents with weakness, some shortness of breath, decreased p.o. intake and dehydration for two days. She also has diarrhea since then which is up to 5 times per day, orange in color, without dark tarry stools or blood in the stool. She had gone to her primary care physician and was told she had some kind of a bacterial infection and was given Levaquin, which the patient still has not taken. She has no chest pains, palpitations, or sick contacts. The primary care physician had also told her not take her diuretics for two days. On arrival here, she was noted to have a left lower lobe infiltrate and given azithromycin and ceftriaxone. She had borderline blood pressure with a systolic when I was in the room about 90. She denies dizziness. Hospitalist Services were contacted for further evaluation and management.   PAST MEDICAL HISTORY:  1. Hypertension. 2. Atrial fibrillation. 3. Coronary artery disease, status post CABG. 4. Knee arthritis, status post left knee replacement. 5. Hypothyroidism. 6. Dyslipidemia. 7. Anxiety. 8. Gastroesophageal reflux disease.  9. Chronic kidney disease.  ALLERGIES: ACE inhibitor, Cozaar, diphenhydramine.   MEDICATIONS:  1. Levothyroxine 125 mcg daily.  2. Bupropion SR 150 mg daily.  3. Imdur 60 mg daily.  4. Aldactone 25 mg daily.  5. Coumadin 2 mg daily.  6. Flonase Lopressor 25 mg b.i.d.  7. Aspirin 325 mg daily.  8. Protonix 40 mg daily.  9. Digoxin 0.125 mg daily.   FAMILY HISTORY: Coronary artery disease  in both parents and a son.   SOCIAL HISTORY: Lives alone. She is retired. She does not smoke tobacco, alcohol or use drugs.   REVIEW OF SYSTEMS: CONSTITUTIONAL: Denies fever, although she did have some sweats last night. She denies overall weakness without any focal weakness. No weight changes. EYES: Denies blurry vision, status post cataract removal. ENT: No tinnitus, ear pain, snore or sore throat. She has no runny nose. RESPIRATORY: No cough, wheezing. Some shortness of breath. No painful respiration. CARDIOVASCULAR: No chest pains, orthopnea, or swelling in the legs. He has history of atrial fibrillation. Some shortness of breath. No palpitations. GI: No nausea or vomiting. Some diarrhea as above. Some abdominal pain. No melena or rectal bleeding. GENITOURINARY: No dysuria, hematuria, or frequency. ENDOCRINE: Denies polyuria or nocturia. She has hypothyroid state. HEMATOLOGIC/LYMPHATIC: Has chronic anemia. No bleeding. No swollen glands. SKIN: Denies any rashes. MUSCULOSKELETAL: Has chronic arthritis. NEUROLOGICAL: No focal weakness or numbness. No dementia. Had headache on Wednesday. PSYCHIATRIC: Has anxiety.   PHYSICAL EXAMINATION:  VITAL SIGNS: Temperature on arrival 98.4, pulse 91, respiratory rate 16, blood pressure on arrival 85/47, currently 88/52, oxygen saturation 99% on room air.   GENERAL: The patient is an elderly Caucasian female lying in bed in no obvious distress, talking in full sentences.    HEENT: Normocephalic, atraumatic. Pupils are equal and reactive. Extraocular muscles are intact. Anicteric sclerae. Dry mucous membranes. No lymphadenopathy. Neck is supple.   CARDIOVASCULAR: S1, S2. There is a murmur right at the sternum.   LUNGS: Good air entry upper lungs bilaterally. There are some rales bilateral, more  on the left than the right base.   ABDOMEN: Soft, nondistended. Some generalized tenderness. No rebound or guarding.   EXTREMITIES: No significant lower extremity  edema.   NEUROLOGICAL: Cranial nerves II through XII are grossly intact. Strength five out of five in all extremities.   LABORATORY, DIAGNOSTIC AND RADIOLOGICAL DATA:  BNP 29,434. Glucose 84. BUN 54, creatinine 2.39-it was 1.52 on 01/29/2011, sodium 129, chloride 88.  Troponin 0.09. CK-MB 1.2. CK total 40.  Bilirubin 2.4, alkaline phosphatase 166, AST 29, ALT 22. TSH 1.86. Hemoglobin 9.4, hematocrit 27.9, WBC 10.1, platelets 152, INR is 3.6.  Urinalysis is negative for nitrates or leukocyte esterase.  EKG: Paced, rate 80.  PA and lateral x-ray of the chest showing hazy right lower lobe airspace disease concerning for pneumonia. No pleural effusions or pneumothorax. Heart size is enlarged.  Abdomen, flat and erect: Unremarkable. Left nephrolithiasis.  Ultrasound of abdomen: Prior cholecystectomy. No intra or extrahepatic biliary ductal dilation. Common duct measures 2.9 mm. The visualized portion of the pancreas is normal. A 1.7 x 1.1 x 1.5 cm right upper pole renal mass, likely cyst.   ASSESSMENT AND PLAN: We have an 81 year old Caucasian female with history of atrial fibrillation, chronic systolic congestive heart failure, chronic kidney disease, coronary artery disease, status post coronary artery bypass graft, atrial fibrillation, on Coumadin, who presents with two days of weakness, decreased p.o. intake, an episode of sweating without chest pain, who likely has a pneumonia as well as acute on chronic CKD, relative hypotension. She has gotten ceftriaxone and azithromycin in the ER which I would continue. Get sputum cultures, urine strep, and Legionella antigens and blood cultures. I would also start some gentle fluids given acute renal failure which appears to be prerenal in the setting of diarrhea, GI losses and volume depletion, and dehydration. I would also hold diuretics at this point as well as other nephrotoxins, and gently I would fluid-resuscitate the patient given the low ejection  fraction. I would hold the diuretics again and continue the Lopressor for the atrial fibrillation with hold parameters. The patient has elevated BNP but does not appear to be in a volume overload state per physical exam, however. Also, x-ray of the chest does not show any effusions. I would hold her diuretics for now. I would hold the Coumadin given the INR is elevated at 3.6. She does have elevated troponins, however, has no chest pain. This is likely in the setting of renal dysfunction, pneumonia, and hypotension. We would cycle the troponins, resume aspirin, check an echo. She also has some diarrhea, and we would send in stool cultures. She has stable chronic hyponatremia. She had elevated LFTs which is obstructive per liver function test pattern; however, she is status post cholecystectomy, and ultrasound has been done already. This will be monitored.    CODE STATUS: The patient is a  FULL CODE.     TOTAL TIME SPENT:  55 minutes.  ____________________________ Vivien Presto, MD sa:cbb D: 04/27/2011 18:15:35 ET T: 04/27/2011 18:36:43 ET JOB#: [id number]  cc: Vivien Presto, MD, <Dictator> Vivien Presto MD ELECTRONICALLY SIGNED 05/11/2011 18:42

## 2014-06-13 NOTE — Consult Note (Signed)
CC: urinary retentionyo female admitted for pneumonia and bacteremia who has been unable to urinate since admission.  Nurses were doing clean intermittent catheterization but foley catheter was placed 04/28/11 for persistant retention. reports having urinary incontinence several years ago and she underwent a "bladder surgery" by Dr. Davis Gourd.  After that she had some difficulty voiding but more recently she has urinary incontinence.  Some days she is completely dry and other days she wears a depends when she goes into public.   denies any recent urinary tract infections.  She denies dysuria, hematuria or other GU symptoms.    1. Hypertension. 2. Atrial fibrillation on coumadin. Coronary artery disease, status post CABG. Knee arthritis, status post left knee replacement. Hypothyroidism. Dyslipidemia. Anxiety. Gastroesophageal reflux disease.  Chronic kidney disease.ColitisRestless leg syndromeasthmanon-hodgins lymphoma cholecystectomyappendectomy at age 38hysterectomy"bladder surgery"/ enterocele repair 2003carotid endarterectomytonsillectomyleft TKACABG  1. Levothyroxine 125 mcg daily.  2. Bupropion SR 150 mg daily.  Imdur 60 mg daily.  Aldactone 25 mg daily.  Coumadin 2 mg daily.  Flonase   Lopressor 25 mg b.i.d.  Aspirin 325 mg daily.  Protonix 40 mg daily.  Digoxin 0.125 mg daily.   ACE inhibitor, Cozaar, diphenhydramine. NT:ZGYFV, is independent.  Denies tobacco or ETOH use.  Three children, vaginal deliveries Hx: [place]  REVIEW OF SYSTEMS: CONSTITUTIONAL: Denies fever, although she did have some sweats at homet. General malaise. No weight changes. EYES: Denies blurry vision, status post cataract removal. ENT: No tinnitus, ear pain, snore or sore throat. She has no runny nose. RESPIRATORY: No cough, wheezing. Some shortness of breath. No painful respiration. CARDIOVASCULAR: No chest pains, orthopnea, or swelling in the legs. He has history of atrial fibrillation. Some shortness of breath. No  palpitations. GI: No nausea or vomiting. Some diarrhea as above. Some abdominal pain. No melena or rectal bleeding. GENITOURINARY: No dysuria, hematuria, or frequency. ENDOCRINE: Denies polyuria or nocturia. She has hypothyroid state. HEMATOLOGIC/LYMPHATIC: Has chronic anemia. No bleeding. No swollen glands. SKIN: Denies any rashes. MUSCULOSKELETAL: Has chronic arthritis. NEUROLOGICAL: No focal weakness or numbness. No dementia. Had headache on Wednesday. PSYCHIATRIC: Has anxiety. Temperature  Temperature Temperature (F) : 97.4   Celsius : 36.3 degrees C Pulse Pulse : 78 Respirations : 18  Systolic BP Systolic BP : 494  Diastolic BP (mmHg) Diastolic BP (mmHg) : 77   Mean BP : 94 mm HgOx % Pulse Ox % : 98 Ox Activity Level  : At restDelivery : 2Lelderly, ill appearing femaleunlabored breathingRRRsoft, ND, mild generalized tendernessfoley catheter in place.  no abnormalities on external genitaliaprolapse 1.75 (from 2.3) baseline 1.5-2.09.0negative strep pneumo  PROCEDURE: [place]  - US ABDOMEN LIMITED SURVEY  - Apr 27 2011  3:59PM  Comparison: None Multiple gray-scale and color-flow Doppler images of the right quadrant are presented for review.  portions of the liver demonstrate normal echogenicity and contours. The liver is without evidence of a focal hepatic lesion.  cholecystectomy. There is no intra- or extrahepatic biliary ductal The common duct measures 2.9 mm in maximal diameter. visualized portion of the pancreas is normal in echogenicity.  is a 1.7 x 1.1 x 1.5 cm right upper pole renal mass with increased transmission most consistent with a cyst.  cholecystectomy. renal cyst[age] yo female with urinary retention.  No evidence of infection but patient is debilitated from illness and retention is most likely secondary to illness.  Recommend keeping foley for next few days until patient has improved medically and can get up on own and use restroom.  If patient  continues to have retention may need further  urologic work up but this is unlikely.  plan discussed with patient.        Electronic Signatures: Margy Clarks (MD) (Signed on 10-Mar-13 16:02)  Authored   Last Updated: 10-Mar-13 16:06 by Margy Clarks (MD)

## 2014-06-13 NOTE — Consult Note (Signed)
Brief Consult Note: Diagnosis: dementia of unknown eitioogy. Resolving delirium.   Patient was seen by consultant.   Consult note dictated.   Recommend further assessment or treatment.   Comments: Psychiatry: Patient seen. Chart reviewed. Note dictated. To my exam now patient has significant dementia (MMSE=11) but is not currently delirious. Without having seen her before I'm not sure how big a decline this is from how she was prior to admission, but it sounds like her son says she was significantly better just a short time ago. Stroke is the most common cause of rapid progression of dementia. Right now I do not have any suggestions for work up that are not already being considered. She does not appear to be agitated or requiring medication for agitation. Can't rule out mood disorder but I do not think it explains her presentation. Will follow.  Electronic Signatures: Gonzella Lex (MD)  (Signed 11-Apr-13 16:56)  Authored: Brief Consult Note   Last Updated: 11-Apr-13 16:56 by Gonzella Lex (MD)

## 2014-06-13 NOTE — Consult Note (Signed)
Details:    - Psychiatry: Came by to see patient. She was alone in the room but was awake and pleasantly interactive. Knew where she was and the correct year and month. Knew she was to be discharged today. Affect care and thoughts lucid. Neatly groomed. Not agitated. Not currently delirious. No change to current care at this time. Follow up and rehab/assisted living by in house MD.   Electronic Signatures: Alexandr Yaworski, Madie Reno (MD)  (Signed 15-Apr-13 12:43)  Authored: Details   Last Updated: 15-Apr-13 12:43 by Gonzella Lex (MD)

## 2014-06-13 NOTE — Consult Note (Signed)
PATIENT NAME:  Anne [name], Anne [name] MR#:  [id number] DATE OF BIRTH:  Feb 28, 1927  DATE OF CONSULTATION:  07/05/2011  REFERRING PHYSICIAN:   CONSULTING PHYSICIAN:  Janalee Dane, MD  HISTORY OF PRESENT ILLNESS: The patient is an 81 year old white female who has a recent medical history significant for nosebleed that has occurred episodically over a few weeks and she was seen in the clinic by Dr. Richardson Landry yesterday and some silver nitrate cautery was done to the nose. She, unfortunately, started bleeding again last night and Dr. Richardson Landry was called. He recommended that a pack be placed and that the patient be evaluated by Internal Medicine for reversal of Coumadin (purpose for Coumadin atrial fibrillation). The patient continued to bleed despite the pack and Dr. Richardson Landry was "off call" and asked for my help with the patient. At my introduction to the patient, she had several Kleenexes that were dotted with blood and she was actively dabbing the area. I had discussed the case with Dr. Payton Doughty and Dr. Payton Doughty understood that I was in the middle of doing surgery and that I would get there within an hour's time.   ALLERGIES: Darvocet, codeine, Benadryl.  MEDICATIONS: 1. Voltaren.   2. Tylenol. 3. Tramadol. 4. Senna. 5. Pantoprazole. 6. Ondansetron. 7. Nitrostat. 8. Metoprolol. 9. Levothyroxine. [place].   11. Isosorbide. 12. Haldol. 13. Fluticasone. 14. Ensure. 15. Calcium. 16. Bupropion.   17. Bisacodyl. 18. Aspirin. 19. Augmentin. 20. Advair.    PAST MEDICAL HISTORY:  1. Atrial fibrillation. 2. Colitis. 3. Restless leg syndrome.  4. Renal insufficiency. 5. Coronary artery disease. 6. Asthma. 7. Anemia. 8. Hypothyroidism. 9. Hyperlipidemia. 10. Hypertension. 11. Non-Hodgkin's lymphoma.   PAST SURGICAL HISTORY:  1. Cholecystectomy. 2. Left carotid endarterectomy. 3. Appendectomy. 4. Hysterectomy. 5. Tonsillectomy. 6. Left TKR. 7. Coronary artery bypass graft x2.    PHYSICAL EXAMINATION:   GENERAL: The patient is a very sweet elderly white female who is very anxious particularly referable to her nose. She is constantly dabbing at the nose with the Kleenex and states that "I've lost a lot of blood".   ORAL CAVITY AND OROPHARYNX: There is a clot in the posterior nasopharynx which was suctioned clear with a Yankauer suction after removal of the pack that was placed primarily anteriorly. There was moderate oozing of blood from the left anterior septum fairly diffuse and evidence of silver nitrate both at the septum and in the nasal sill and nasal ala.   PROCEDURE: Formal anterior and posterior pack. The full 7 cm Pope pack was placed with Polysporin ointment and inflated with lidocaine with epinephrine. The patient tolerated the procedure well. There was no further bleeding and the oropharynx was completely clear.   IMPRESSION: Left nasal septal bleed in an anticoagulated patient. According to Dr. Richardson Landry, the Internal Medicine physician recommended reversal of the Coumadin and ordered FFP and Vitamin K. The nurse on duty in the Emergency Room advises me that she has received the Vitamin K and the FFP has been ordered but "takes a while to get here". At this point the patient's blood pressure is stable and she is relatively comfortable. I reassured her that she will have some blood that will soak the pack and that she will be able to dab Kleenex and get it to turn red with that. I fully expect this and the pack will need to stay in place until the patient is seen back by Dr. Richardson Landry on Monday which is when he was scheduled to see the patient  originally. In the meantime, the patient will take antibiotics to prevent toxic shock syndrome. She will use a humidifier and ice to the bridge of the nose on a p.r.n. basis.   ____________________________ Lenna Sciara. Nadeen Landau, MD jmc:[id number] D: 07/05/2011 17:34:31 ET T: 07/06/2011 08:27:47 ET JOB#: [id number]  cc: Janalee Dane,  MD, <Dictator> Nicholos Johns MD ELECTRONICALLY SIGNED 07/14/2011 5:25

## 2014-06-13 NOTE — H&P (Signed)
PATIENT NAME:  Anne [name], Anne [name] MR#:  [id number] DATE OF BIRTH:  1927/08/10  DATE OF ADMISSION:  05/29/2011  PRIMARY CARE PHYSICIAN: Apolonio Schneiders, MD   REFERRING PHYSICIAN: ER physician, Dr. Benjaman Lobe    CHIEF COMPLAINT: Change in mental status, confusion, hallucinations.   HISTORY OF PRESENT ILLNESS: The patient is an 81 year old female with past medical history of hypertension, atrial fibrillation, and coronary artery disease who was recently admitted to [facility] from 04/27/2011 to 05/08/2011 for treatment of acute on chronic renal failure and Streptococcal bacteremia and pneumonia. The patient was subsequently discharged to [facility] rehab facility. The patient is currently confused and unable to provide adequate history. 38 of information was obtained from the patient's son and power-of-attorney at bedside. As per the patient's son, the patient has not made the expected recovery in spite of being in rehab for 2-1/2 weeks. She has been having intermittent headache, not been feeling good with poor appetite. She has not done well with physical therapy. She has been having alternating diarrhea and constipation. He last saw her three days ago and she was fine and at her baseline. The son denies that the patient has any dementia. Usually she is very mentally sharp. He calls her everyday at 9 a.m. He called her room today but she did not pick up the phone. Therefore, he alerted the rehab facility staff who went to see the patient and found her to be confused and hallucinating. Therefore, she was sent to the Emergency Room. As per the ED physician, the patient was started on a scopolamine patch last night for some nausea. She also has chronic hyponatremia which appears to be slightly worse with a sodium level of 124. There is no history of any fever, cough, or chest pain that the patient's son knows about. He is concerned and is requesting a sitter since the patient has been confused and tried to get out of  bed. He is worried about her falling and getting hurt.   ALLERGIES: Codeine, Darvocet, and Benadryl.   PAST MEDICAL HISTORY:  1. Hypertension.  2. Atrial fibrillation. 3. Coronary artery disease status post coronary artery bypass graft. 4. Bilateral knee replacement. 5. Hypothyroidism. 6. Dyslipidemia. 7. Anxiety. 8. Gastroesophageal reflux disease.  9. History of chronic kidney disease. 10. Admission to [facility] from 04/27/2011 to 05/08/2011 for treatment of Streptococcal pneumonia and bacteremia. 11. Acute on chronic renal failure. 12. Chronic systolic congestive heart failure with EF of 35%.   CURRENT MEDICATIONS:  1. Advair 250/50 one puff b.i.d.  2. Aspirin 81 mg daily.  3. Dulcolax 10 mg PR p.r.n. for constipation.  4. Wellbutrin 150 mg daily.  5. Calcium carbonate 2 tablets b.i.d. 6. Ensure one can t.i.d.  7. Flonase one spray each nostril daily.  8. Ibuprofen p.r.n.  9. Imdur 60 mg daily.  10. Coumadin 1 mg daily.  11. Synthroid 125 mcg daily.  12. Lopressor 25 mg b.i.d.  13. Nitroglycerin p.r.n.  14. Zofran p.r.n. 15. Protonix 40 mg daily. 16. Senna Plus 1 tablet daily.  17. Spiriva 81 mcg daily. 18. Spironolactone 25 mg daily.  19. Tramadol 50 mg p.r.n.  20. Tylenol p.r.n.  21. Voltaren gel p.r.n.   FAMILY HISTORY: Positive for coronary artery disease in both her parents and a son.   SOCIAL HISTORY: She is currently living in [facility] rehab. She is retired. There is no history of smoking, alcohol, or drug abuse.   REVIEW OF SYSTEMS: The patient is confused. She is only oriented to self  and is unable to provide a reliable review of systems.   PHYSICAL EXAMINATION:   VITAL SIGNS: Temperature 96, heart rate 96, respiratory rate 22, blood pressure 146/76, pulse oximetry 97% on room air.    GENERAL: The patient is an elderly 81 year old female who is laying in bed. She is confused, oriented only to self. She does recognize her sister and son at bedside. She is  restless and fidgety and is picking on her clothes and her IV line.   HEENT: Head atraumatic, normocephalic.   EYES: There is some pallor. No icterus or cyanosis. Pupils equal, round, and reactive to light and accommodation.   ENT: Dry mucous membranes. No oropharyngeal erythema or thrush.   NECK: Supple. No masses. No JVD. No thyromegaly. No lymphadenopathy.   CHEST WALL: No tenderness to palpation. Not using accessory muscles of respiration. No intercostal muscle retractions.   LUNGS: Bilateral basal crepitations.   CARDIOVASCULAR: S1, S2 irregularly irregular. There is a systolic murmur. No rubs or gallops.   ABDOMEN: Soft, nondistended. No guarding. No rigidity. No organomegaly. Normal bowel sounds.   SKIN: The patient has some ecchymosis and chronic age related skin changes. No rashes or lesions.   PERIPHERIES: There is trace pedal edema. 2+ pedal pulses.   MUSCULOSKELETAL: No cyanosis or clubbing.   NEUROLOGIC: The patient is alert but confused, generalized weakness, however, motor strength is 5 out of 5. Cranial nerves are grossly intact.   PSYCH: Confused, restless.   LABORATORY, DIAGNOSTIC, AND RADIOLOGICAL DATA: CK, troponin normal. Glucose 97, BUN 10, creatinine 0.90, sodium 124, potassium 4.7, chloride 8.8. Rest of CMP is normal. White count 8.4, hemoglobin 9.7, hematocrit 28.5, platelets 281.   Chest x-ray shows increased density at the left lung base compatible with pneumonia or atelectasis. Stable cardiomegaly. Marked arthritic changes in both shoulders.   CAT scan of the head showed no acute abnormalities.  ASSESSMENT AND PLAN: This is an 81 year old female with multiple medical problems including coronary artery disease, hypertension, and hyperlipidemia who presents with change in mental status/confusion.  1. Altered mental status/encephalopathy. The etiology is unclear at present, possibly related to use of scopolamine patch as well as hyponatremia. As per the  patient's son, she is normally very sharp but does have intermittent periods of confusion. Will discontinue the scopolamine patch. Also check TSH level since she has a history of hypothyroidism to determine if she is being over or under treated. Will check RPR and B12 level.  2. Hyponatremia. The patient had chronic hyponatremia in the past. Her sodium level has been as low as 127 to 128. Will hydrate with 2 liters of fluid and if it improves her sodium level and her mentation.  3. Anemia likely of chronic disease. Her hemoglobin is 9.7 and appears to be stable as compared to previous records. 4. Recent Streptococcal pneumonia bacteremia. Chest x-ray shows left base density compatible with pneumonia, however, the patient has completed a course of antibiotics. Her white count is normal and she is afebrile. Will check urine and blood cultures to rule out any acute infection now causing the patient's altered mental status.  5. Hypertension, appears to be controlled. Will continue current medications.  6. Atrial fibrillation, appears to be controlled. Will continue current medications. The patient is on Coumadin. PT-INR is not available for review. Will order a check and adjust Coumadin based on the results. 7. Hypothyroidism. Continue Synthroid. Check a TSH level.  8. History of chronic kidney disease. Renal function appears to be stable at  present.  9. Confusion. The patient's son is concerned that the patient has been trying to get out of bed and is very fidgety and restless. He is concerned about her safety, therefore, he is requesting a sitter   CODE STATUS: The patient is a FULL CODE at the time.   Reviewed old medical records, discussed with ED physician, discussed with the patient's son and sister the plan of care and management.   TIME SPENT: 75 minutes.   ____________________________ Cherre Huger, MD sp:drc D: 05/29/2011 16:48:34 ET T: 05/29/2011 17:36:33 ET JOB#: [id number]  cc: Cherre Huger, MD, <Dictator> Vianne Bulls. Arline Asp, MD Cherre Huger MD ELECTRONICALLY SIGNED 05/29/2011 20:58

## 2014-06-13 NOTE — Discharge Summary (Signed)
PATIENT NAME:  Anne [name], Anne [name] MR#:  [id number] DATE OF BIRTH:  [birth year]  DATE OF ADMISSION:  05/29/2011 DATE OF DISCHARGE:  06/05/2011  DISCHARGE DIAGNOSES:  1. Altered mental status and confusion, may be secondary to acute delirium with hyponatremia, maybe underlying mild dementia. 2. Hyponatremia, improved.  3. Minimal rhabdomyolysis.  4. Hypothyroidism with suppressed TSH. 5. Coronary artery disease with previous coronary artery bypass graft. 6. Chronic systolic failure, compensated. 7. History of atrial fibrillation in the past, on Coumadin. 8. Hypertension. 9. Hyperlipidemia. 10. Depression. 11. History of bilateral knee replacements in the past.  CONSULTANTS: 1. Alethia Berthold, MD - Psychiatry. 2. Michaela Corner, MD - Neurology.  HOSPITAL COURSE: The patient is an 81 year old female who has history of coronary artery disease, chronic systolic failure, hypertension, hyperlipidemia, chronic atrial fibrillation, and hypothyroidism. She was admitted to the hospital prior to this admission, from 04/27/2011 to 05/08/2011, for pneumonia. She was discharged to [facility] for rehab. She was sent from [facility] because of change in mental status, confusion, and hallucinations. The patient was admitted as altered mental status with some hyponatremia. The patient has chronic hyponatremia. The last time in March her sodium level was in the range of 130, but this time when she came in her sodium level was in the range of 124. When she came in, her Aldactone was held. Psych was also consulted. They did a mini-mental status examination on her and it was around 11. They said the patient has significant dementia. To further work up her delirium, a CT scan of the head was done at admission which was negative, only showed involutional changes without any acute abnormalities, and a repeat CT scan of the head was done because the patient could not have a MRI because of her pacemaker. So repeat CT of the head did  not show any acute abnormalities. There was no stroke. We checked her RPR level and that was not reactive and a vitamin B12 level that was normal at 582. When she came in her creatinine was normal at 0.9. Her TSH was slightly elevated at 4.98. Her CK was on the high side at 519. Her Synthroid dose was increased to 137 mcg daily. Her sodium has come up nicely to 134 right now. I am going to restart her Aldactone at a very low dose, at 12.5 mg daily. She was taking 25 mg daily. If her sodium level drops again to less than 130, then Aldactone should be stopped. Neurology also saw her and thinks because of her chronic atrial fibrillation she must be having some underlying dementia. She was put on Haldol here and I will discharge her on Haldol as needed for paranoia or agitation. She was continued on her antidepressant medication, Wellbutrin XL 150 mg daily. Currently she is oriented. She is ambulating around.  DISCHARGE MEDICATIONS:  1. Senna Plus 1 tablet once a day.  2. Ensure three times daily. 3. Advair Diskus 250/50 one puff twice a day. 4. Aspirin 81 mg daily.  5. Wellbutrin XL 150 mg daily. 6. Calcium 600 mg two tablets twice a day.  7. Fluticasone nasal spray in each nostril once a day at 8:00 p.m.  8. Imdur 60 mg daily. 9. Metoprolol 25 mg twice a day. 10. Protonix 40 mg daily.  [place] daily. 12. Voltaren gel 4 grams topically four times daily to painful area.  13. Tylenol 650 mg every 4 hours as needed. 14. Nitroglycerin p.r.n.  15. Ondansetron 4 mg every 6 hours as  needed.  16. Tramadol 50 mg 1 tablet p.o. every 6 hours p.r.n.  17. Bisacodyl one suppository once a day as needed for constipation. 18. Change levothyroxine to 0.137 mg p.o. once daily. 19. Change Haldol to 1 mg p.o. three times daily as needed for agitation or paranoia.  20. Change Jantoven (Warfarin) to 2 mg p.o. once daily at 5:00 p.m. 21. Change spironolactone to 12.5 mg (1/2 tablet of 25 mg) p.o. once  daily.   NOTE: Do not take Aldactone if sodium level is less than 30 and do not take ibuprofen.  DIET: Advised a low-sodium diet.  ACTIVITY:  PT at rehab.   CONDITION AT DISCHARGE: She is stable. T-max is 98.4, heart rate 75, blood pressure 114/85 and saturating 95% on room air. Chest has minimal crackles at the left base. Heart sounds are regular. Abdomen is soft and nontender. She is awake, alert, and oriented x3.   DISCHARGE INSTRUCTIONS/FOLLOWUP: The patient should follow-up with the MD at short-term rehab versus Dr. Arline Asp in one week. Follow-up INR and serum sodium level on 06/08/2011 with results to be PCP for use of Aldactone.   TIME SPENT ON DISCHARGE: 50 minutes. ____________________________ Mena Pauls, MD ag:slb D: 06/05/2011 12:38:01 ET T: 06/05/2011 13:06:57 ET JOB#: [id number]  cc: Mena Pauls, MD, <Dictator> Vianne Bulls. Arline Asp, MD [place] MD ELECTRONICALLY SIGNED 06/14/2011 11:18

## 2014-06-13 NOTE — Discharge Summary (Signed)
PATIENT NAME:  Anne [name], Anne [name] MR#:  [id number] DATE OF BIRTH:  [birth year]  DATE OF ADMISSION:  05/29/2011 DATE OF DISCHARGE:  06/05/2011  ADDENDUM: Also when she came in, her INR was subtherapeutic. Her INR was only 1.2 on 1 mg of warfarin. I have increased her Coumadin dose to 2 mg daily. She was getting 2.5 mg here. Her INR needs to be monitored as an outpatient. She should have frequent sodium levels checked also to make sure that she is not severely hyponatremic again. I am going to stop her ibuprofen at this time because it could lead to high incidence of bleed when she is already on Coumadin. She is already on tramadol and Tylenol. That should help. ____________________________ Mena Pauls, MD ag:slb D: 06/05/2011 12:40:51 ET T: 06/05/2011 12:47:54 ET JOB#: [id number]  cc: Mena Pauls, MD, <Dictator> Vianne Bulls. Arline Asp, MD Mena Pauls MD ELECTRONICALLY SIGNED 06/14/2011 11:16

## 2014-06-13 NOTE — Discharge Summary (Signed)
PATIENT NAME:  Anne [name], Anne [name] MR#:  [id number] DATE OF BIRTH:  [birth year]  DATE OF ADMISSION:  04/27/2011 DATE OF DISCHARGE:  05/08/2011  CHIEF COMPLAINT: Weakness.   DISCHARGE DIAGNOSES:  1. Strep pneumonia bacteremia. 2. Acute on chronic renal failure. 3. Chronic hyponatremia. 4. Elevated digoxin level. 5. Chronic systolic congestive heart failure. 6. Aphthous stomatitis.   SECONDARY DIAGNOSES:  1. Gastroesophageal reflux disease. 2. Anxiety.  3. Dyslipidemia.  4. Hypothyroidism.  5. Coronary artery disease status post coronary artery bypass graft. 6. History of atrial fibrillation.   DISCHARGE MEDICATIONS:  1. Synthroid 125 mcg daily.  2. Aspirin 325 mg p.o. daily. 3. Bupropion 150 mg extended-release 1 tab daily at bedtime.  4. Spironolactone 25 mg daily.  5. Calcium 600 mg two tabs twice a day. 6. Flonase 50 mcg one spray once a day. 7. Imdur 60 mg once a day in the morning. 8. Nitroglycerin 0.4 mg sublingual as needed.  9. Protonix 40 mg daily.  10. Lopressor 25 mg p.o. twice a day. 11. Cefuroxime 500 mg p.o. twice a day for two more days.  12. Warfarin 1 mg p.o. daily. 13. Zofran 4 mg every six hours as needed for nausea.   DIET: Low sodium, ADA diet.   ACTIVITY: As tolerated.   REFERRALS: Physical therapy.   DISCHARGE FOLLOWUP/INSTRUCTIONS: Please follow-up with your physician and also check digoxin level and INR in two days.   HISTORY OF PRESENT ILLNESS: Please refer to the full History and Physical dictated on 04/27/2011 by Dr. Vivien Presto, but briefly this is an 81 year old female with history of atrial fibrillation on Coumadin, coronary artery disease status post CABG, chronic congestive heart failure with an ejection fraction of 35%, and chronic obstructive pulmonary disease who presents with weakness, shortness of breath, decreased p.o. intake, and dehydration and was found with pneumonia as well as acute renal failure on arrival. She was admitted to  the hospitalist service for further evaluation and management.   SIGNIFICANT LABS/IMAGING. BNP on arrival 29,434. BUN 54, creatinine on arrival 2.39 and by discharge 0.91, and serum sodium on arrival 129 which has been stable. Initial LFTs: Total bilirubin was 2.4, alkaline phosphatase 166, albumin 3.1, AST and ALT within normal limits. By 05/02/2011, significantly trending down bilirubin within normal limits. Alkaline phosphatase 150. Initial troponin 0.09, then 0.11, then 0.08. TSH 1.86. Digoxin level 3.2 on 05/07/2011 and on 05/08/2011 it is 2.7, trending down. Initial WBC was 10.1, hemoglobin 9.4, and hematocrit 27.9. Hemoglobin on 05/06/2011 was 9.8. Initial INR 3.6 and by discharge 2.4.   Blood cultures on arrival, two out of two bottles growing streptococcus pneumonia.   Stool cultures from 04/28/2011 - negative.  Clostridium difficile from 04/28/2011 - negative.   Blood cultures from 04/29/2011 - negative.   Urine culture from 04/29/2011 - negative.  Throat culture from 05/01/2011 - negative for strep.   Echocardiogram on 04/28/2011 showed an ejection fraction of 35%, severe TR, mild to moderate MR.   Initial x-ray of the chest, PA and lateral, showed right lower lobe airspace disease concerning for pneumonia.   Abdomen flat and erect, on 04/27/2011, showed an unremarkable radiograph.  Ultrasound of abdomen on arrival showed prior cholecystectomy. Right renal cyst.  Repeat x-ray of the chest on 05/03/2011 again noted loss of clarity in the left hemidiaphragm and on 04/30/2011 again left hemidiaphragm remains indistinct, which suggests some residual infiltrate in the left base. No new infiltrate.   CT of the head without contrast on 05/07/2011 was stable  brain CT.  HOSPITAL COURSE:  For the pneumonia, the patient was started on ceftriaxone and azithromycin. Subsequently blood cultures became positive for strep pneumonia. The patient likely has strep pneumo pneumonia. The  antibiotics were continued. Blood cultures were repeated which were negative from 04/29/2011. The patient did not have any significant leukocytosis. The patient has been treated with seven days of antibiotics, ceftriaxone, and currently she is on cefuroxime 500 mg p.o. twice a day, which she is to continue for an additional two days. She has remained afebrile and is on room oxygen and not hypoxic. She did have significant acute renal failure which resolved with initially holding her diuretics and giving her some fluid back. By discharge it had normalized. Although she did have elevated BNP, she did not have any evidence of congestive heart failure. An echocardiogram was repeated which showed ejection fraction of 35%. Her aspirin, beta blocker, Demadex, and spironolactone could be resumed as an outpatient. Her digoxin has been held as her level has been supratherapeutic. She can recheck a digoxin level in two days. She did have elevated troponin on arrival, however, this was felt to be more likely from renal dysfunction and relative hypotension that she had on admission. It has been trending down and she has no chest pain. For her atrial fibrillation, her Coumadin initially was held as the INR was slightly supratherapeutic. However, it has been resumed. She is being paced and is having good rhythm control. She did have some diarrhea on arrival, however, she had negative stool studies and that has since resolved. She has chronic stable hyponatremia. She did have aphthous stomatitis and was on Carafate. She has also some dysphagia and had thrush and has had treatment with Diflucan and has improved. She was evaluated by physical therapy and will be going to rehab for further care.   CODE STATUS: FULL CODE.   TOTAL TIME SPENT: 35 minutes. ____________________________ Vivien Presto, MD sa:slb D: 05/08/2011 16:12:24 ET T: 05/08/2011 16:47:13 ET JOB#: [id number]  cc: Vivien Presto, MD, <Dictator> Vianne Bulls.  Arline Asp, MD Vivien Presto MD ELECTRONICALLY SIGNED 05/11/2011 18:42

## 2014-06-13 NOTE — Consult Note (Signed)
Details:    - Psychiatry: Came by to follow up. Patient was alert and more interactive. Oriented. Did not repeat full exam but seemed more awake. Hopefully recovering. No change to treatment reccomendations.   Electronic Signatures: Gonzella Lex (MD)  (Signed 12-Apr-13 17:07)  Authored: Details   Last Updated: 12-Apr-13 17:07 by Gonzella Lex (MD)
